# Patient Record
Sex: Male | Born: 1985 | Hispanic: No | Marital: Married | Smoking: Former smoker
Health system: Southern US, Community
[De-identification: ages and names within clinical notes are randomized; demographics above are authoritative.]

## PROBLEM LIST (undated history)

## (undated) DIAGNOSIS — J45909 Unspecified asthma, uncomplicated: Secondary | ICD-10-CM

---

## 2004-01-12 ENCOUNTER — Emergency Department (HOSPITAL_COMMUNITY): Admission: EM | Admit: 2004-01-12 | Discharge: 2004-01-13 | Payer: Self-pay | Admitting: Emergency Medicine

## 2004-01-22 ENCOUNTER — Emergency Department (HOSPITAL_COMMUNITY): Admission: EM | Admit: 2004-01-22 | Discharge: 2004-01-22 | Payer: Self-pay | Admitting: Family Medicine

## 2006-12-24 ENCOUNTER — Emergency Department (HOSPITAL_COMMUNITY): Admission: EM | Admit: 2006-12-24 | Discharge: 2006-12-24 | Payer: Self-pay | Admitting: Emergency Medicine

## 2011-07-05 ENCOUNTER — Inpatient Hospital Stay (INDEPENDENT_AMBULATORY_CARE_PROVIDER_SITE_OTHER)
Admission: RE | Admit: 2011-07-05 | Discharge: 2011-07-05 | Disposition: A | Discharge status: Home / Self Care | Payer: Self-pay | Source: Ambulatory Visit | Attending: Family Medicine | Admitting: Family Medicine

## 2011-07-05 DIAGNOSIS — R6889 Other general symptoms and signs: Secondary | ICD-10-CM

## 2012-09-01 ENCOUNTER — Emergency Department (HOSPITAL_COMMUNITY)
Admission: EM | Admit: 2012-09-01 | Discharge: 2012-09-01 | Disposition: A | Discharge status: Home / Self Care | Payer: Self-pay | Attending: Emergency Medicine | Admitting: Emergency Medicine

## 2012-09-01 ENCOUNTER — Emergency Department (HOSPITAL_COMMUNITY): Payer: Self-pay

## 2012-09-01 ENCOUNTER — Encounter (HOSPITAL_COMMUNITY): Payer: Self-pay | Admitting: Adult Health

## 2012-09-01 DIAGNOSIS — IMO0001 Reserved for inherently not codable concepts without codable children: Secondary | ICD-10-CM | POA: Insufficient documentation

## 2012-09-01 DIAGNOSIS — IMO0002 Reserved for concepts with insufficient information to code with codable children: Secondary | ICD-10-CM | POA: Insufficient documentation

## 2012-09-01 DIAGNOSIS — Z87891 Personal history of nicotine dependence: Secondary | ICD-10-CM | POA: Insufficient documentation

## 2012-09-01 DIAGNOSIS — R42 Dizziness and giddiness: Secondary | ICD-10-CM | POA: Insufficient documentation

## 2012-09-01 DIAGNOSIS — Y9389 Activity, other specified: Secondary | ICD-10-CM | POA: Insufficient documentation

## 2012-09-01 DIAGNOSIS — J45909 Unspecified asthma, uncomplicated: Secondary | ICD-10-CM | POA: Insufficient documentation

## 2012-09-01 DIAGNOSIS — S66919A Strain of unspecified muscle, fascia and tendon at wrist and hand level, unspecified hand, initial encounter: Secondary | ICD-10-CM

## 2012-09-01 DIAGNOSIS — S63509A Unspecified sprain of unspecified wrist, initial encounter: Secondary | ICD-10-CM | POA: Insufficient documentation

## 2012-09-01 DIAGNOSIS — M79639 Pain in unspecified forearm: Secondary | ICD-10-CM

## 2012-09-01 DIAGNOSIS — S6720XA Crushing injury of unspecified hand, initial encounter: Secondary | ICD-10-CM

## 2012-09-01 DIAGNOSIS — M7989 Other specified soft tissue disorders: Secondary | ICD-10-CM | POA: Insufficient documentation

## 2012-09-01 DIAGNOSIS — M25539 Pain in unspecified wrist: Secondary | ICD-10-CM | POA: Insufficient documentation

## 2012-09-01 DIAGNOSIS — Y92009 Unspecified place in unspecified non-institutional (private) residence as the place of occurrence of the external cause: Secondary | ICD-10-CM | POA: Insufficient documentation

## 2012-09-01 DIAGNOSIS — R209 Unspecified disturbances of skin sensation: Secondary | ICD-10-CM | POA: Insufficient documentation

## 2012-09-01 HISTORY — DX: Unspecified asthma, uncomplicated: J45.909

## 2012-09-01 LAB — BASIC METABOLIC PANEL
CO2: 28 mEq/L (ref 19–32)
Calcium: 9.6 mg/dL (ref 8.4–10.5)
Chloride: 100 mEq/L (ref 96–112)
Glucose, Bld: 101 mg/dL — ABNORMAL HIGH (ref 70–99)
Potassium: 4.4 mEq/L (ref 3.5–5.1)
Sodium: 136 mEq/L (ref 135–145)

## 2012-09-01 LAB — CBC WITH DIFFERENTIAL/PLATELET
Basophils Absolute: 0 10*3/uL (ref 0.0–0.1)
Basophils Relative: 0 % (ref 0–1)
Eosinophils Absolute: 0.1 10*3/uL (ref 0.0–0.7)
HCT: 45.2 % (ref 39.0–52.0)
Lymphs Abs: 2.2 10*3/uL (ref 0.7–4.0)
MCH: 29 pg (ref 26.0–34.0)
MCHC: 34.1 g/dL (ref 30.0–36.0)
Neutrophils Relative %: 54 % (ref 43–77)
Platelets: 201 10*3/uL (ref 150–400)
RBC: 5.31 MIL/uL (ref 4.22–5.81)
RDW: 13.1 % (ref 11.5–15.5)

## 2012-09-01 MED ORDER — IBUPROFEN 600 MG PO TABS: 600.0000 mg | ORAL_TABLET | Freq: Four times a day (QID) | ORAL | Status: DC | PRN

## 2012-09-01 MED ORDER — HYDROCODONE-ACETAMINOPHEN 5-325 MG PO TABS: 2.0000 | ORAL_TABLET | ORAL | Status: DC | PRN

## 2012-09-01 MED ORDER — HYDROCODONE-ACETAMINOPHEN 5-325 MG PO TABS
2.0000 | ORAL_TABLET | Freq: Once | ORAL | Status: AC
  Administered 2012-09-01: 2 via ORAL
  Filled 2012-09-01: qty 2

## 2012-09-01 NOTE — Progress Notes (Signed)
Orthopedic Tech Progress Note Patient Details:  Charles Padilla July 09, 1986 [id number]  Ortho Devices Type of Ortho Device: Velcro wrist splint Ortho Device/Splint Location: (R) UE Ortho Device/Splint Interventions: Application   Jennye Moccasin 09/01/2012, 6:23 PM

## 2012-09-01 NOTE — Discharge Instructions (Signed)
Hand Injuries There are many types of hand injuries. You or your child may have a minor broken bone (fracture), sprain, bruises, or burns on the hand. HOME CARE  Keep the hand raised (elevated) above the level of the heart. Do this for the first few days until the pain and puffiness (swelling) get better.  Hand bandages (dressings) and splints are used to keep the hand still. This helps with pain and prevents more injury.  Do not remove the bandage and splint until your doctor says it is okay.  For broken bones, sprains, and bruises, you may put ice on the injured area.  Put ice in a plastic bag.  Place a towel between the skin and the bag.  Leave the ice on for 15 to 20 minutes every few hours. Do this for 2 to 3 days.  Medicine for pain and redness or puffiness (inflammation) is often helpful.  Some hand motion after an injury can decrease stiffness.  Do not do any activities that increase pain in the hand.  See your doctor for follow-up care as told. GET HELP RIGHT AWAY IF:   The pain and puffiness get worse.  The pain is not controlled with medicine.  You or your child has a temperature by mouth above 102 F (38.9 C), not controlled by medicine.  There is pain when moving the fingers.  There is fluid (pus) coming from the wound. MAKE SURE YOU:   Understand these instructions.  Will watch this condition.  Will get help right away if you or your child is not doing well or gets worse. Document Released: 11/19/2009 Document Revised: 11/17/2011 Document Reviewed: 11/19/2009 [facility] Patient Information 2013 [place], [place]. Sprain A sprain happens when the bands of tissue that connect bones and hold joints together (ligaments) stretch too much or tear. HOME CARE  Raise (elevate) the injured area to lessen puffiness (swelling).  Put ice on the injured area.  Put ice in a plastic bag.  Place a towel between your skin and the bag.  Leave the ice on for 15 to 20  minutes, 3 to 4 times a day.  Do this for the first 24 hours or as told by your doctor.  Wear any splints, braces, castings, or elastic wraps as told by your child's doctor.  Eat healthy foods.  Only take medicine as told by your doctor. GET HELP RIGHT AWAY IF:   There is numbness or tingling in the injured limb.  The toes or fingers become blue or white in the injured limb.  The sprained limb is cold to the touch.  There is a sharp, shooting pain in the injured limb.  The puffiness is getting worse instead of better. MAKE SURE YOU:   Understand these instructions.  Will watch this condition.  Will get help right away if you are not doing well or get worse. Document Released: 02/11/2008 Document Revised: 11/17/2011 Document Reviewed: 07/11/2009 [facility] Patient Information 2013 [place], [place].

## 2012-09-01 NOTE — ED Provider Notes (Signed)
26 year old male had a sofa fall in his right arm. He is complaining of pain in his right forearm and right hand. Exam, he has tenderness over the ulnar aspect of his right hand, wrist, and distal forearm without point tenderness. Her neurovascular exam is intact. There is no deformity noted. Hand x-rays have been obtained and are unremarkable and reviewed by myself. However, the area of his forearm where he was tender was not adequately visualize, so he is being sent back for forearm x-rays  I saw and evaluated the patient, reviewed the resident's note and I agree with the findings and plan.   Dione Booze, MD 09/01/12 1740

## 2012-09-01 NOTE — ED Notes (Signed)
Patient states that when he is standing, he feels like he will fall. Also states if he wakes up at night and stands up, a similar feeling starts.

## 2012-09-01 NOTE — ED Provider Notes (Signed)
History     CSN: [id number]  Arrival date & time 09/01/12  1611   First MD Initiated Contact with Patient 09/01/12 1653      Chief Complaint  Patient presents with  . Hand Injury  . Dizziness    (Consider location/radiation/quality/duration/timing/severity/associated sxs/prior treatment) Patient is a 26 y.o. male presenting with injury. The history is provided by the patient. No language interpreter was used.  Injury  The incident occurred today. The incident occurred at home. The injury mechanism was a direct blow. Context: sofa  The wounds were self-inflicted. No protective equipment was used. He came to the ER via personal transport. There is an injury to the right wrist, right hand and right forearm. The pain is moderate. Pertinent negatives include no chest pain, no abdominal pain, no nausea, no vomiting, no headaches and no cough.    Past Medical History  Diagnosis Date  . Asthma     History reviewed. No pertinent past surgical history.  History reviewed. No pertinent family history.  History  Substance Use Topics  . Smoking status: Former Games developer  . Smokeless tobacco: Not on file  . Alcohol Use: No      Review of Systems  Constitutional: Negative for fever and chills.  HENT: Negative for congestion and sore throat.   Respiratory: Negative for cough and shortness of breath.   Cardiovascular: Negative for chest pain and leg swelling.  Gastrointestinal: Negative for nausea, vomiting, abdominal pain, diarrhea and constipation.  Genitourinary: Negative for dysuria and frequency.  Musculoskeletal: Positive for myalgias and arthralgias.  Skin: Negative for color change and rash.  Neurological: Negative for dizziness and headaches.  Psychiatric/Behavioral: Negative for confusion and agitation.  All other systems reviewed and are negative.    Allergies  Review of patient's allergies indicates no known allergies.  Home Medications  No current outpatient  prescriptions on file.  BP 120/84  Pulse 67  Temp 97.7 F (36.5 C) (Oral)  Resp 16  SpO2 100%  Physical Exam  Constitutional: He is oriented to person, place, and time. He appears well-developed and well-nourished. No distress.  HENT:  Head: Normocephalic and atraumatic.  Eyes: EOM are normal. Pupils are equal, round, and reactive to light.  Neck: Normal range of motion. Neck supple.  Cardiovascular: Normal rate and regular rhythm.   Pulmonary/Chest: Effort normal. No respiratory distress.  Abdominal: Soft. He exhibits no distension.  Musculoskeletal: He exhibits no edema.       Right wrist: He exhibits decreased range of motion, tenderness and bony tenderness. He exhibits no swelling.       Right forearm: He exhibits tenderness and bony tenderness. He exhibits no swelling.       Arms:      Right hand: He exhibits tenderness, bony tenderness and swelling.       Hands: Neurological: He is alert and oriented to person, place, and time.  Skin: Skin is warm and dry.  Psychiatric: He has a normal mood and affect. His behavior is normal.    ED Course  Procedures (including critical care time)  Results for orders placed during the hospital encounter of 09/01/12  CBC WITH DIFFERENTIAL      Component Value Range   WBC 6.4  4.0 - 10.5 K/uL   RBC 5.31  4.22 - 5.81 MIL/uL   Hemoglobin 15.4  13.0 - 17.0 g/dL   HCT 46.9  62.9 - 52.8 %   MCV 85.1  78.0 - 100.0 fL   MCH 29.0  26.0 -  34.0 pg   MCHC 34.1  30.0 - 36.0 g/dL   RDW 16.1  09.6 - 04.5 %   Platelets 201  150 - 400 K/uL   Neutrophils Relative 54  43 - 77 %   Neutro Abs 3.5  1.7 - 7.7 K/uL   Lymphocytes Relative 34  12 - 46 %   Lymphs Abs 2.2  0.7 - 4.0 K/uL   Monocytes Relative 10  3 - 12 %   Monocytes Absolute 0.6  0.1 - 1.0 K/uL   Eosinophils Relative 1  0 - 5 %   Eosinophils Absolute 0.1  0.0 - 0.7 K/uL   Basophils Relative 0  0 - 1 %   Basophils Absolute 0.0  0.0 - 0.1 K/uL  BASIC METABOLIC PANEL      Component Value  Range   Sodium 136  135 - 145 mEq/L   Potassium 4.4  3.5 - 5.1 mEq/L   Chloride 100  96 - 112 mEq/L   CO2 28  19 - 32 mEq/L   Glucose, Bld 101 (*) 70 - 99 mg/dL   BUN 11  6 - 23 mg/dL   Creatinine, Ser 4.09  0.50 - 1.35 mg/dL   Calcium 9.6  8.4 - 81.1 mg/dL   GFR calc non Af Amer >90  >90 mL/min   GFR calc Af Amer >90  >90 mL/min    DG Forearm Right (Final result)   Result time:09/01/12 1806    Final result by Rad Results In Interface (09/01/12 18:06:47)    Narrative:   *RADIOLOGY REPORT*  Clinical Data: Injury, pain.  RIGHT FOREARM - 2 VIEW  Comparison: None.  Findings: Imaged bones, joints and soft tissues appear normal.  IMPRESSION: Negative exam.   Original Report Authenticated By: Holley Dexter, M.D.             DG Hand Complete Right (Final result)   Result time:09/01/12 1645    Final result by Rad Results In Interface (09/01/12 16:45:57)    Narrative:   *RADIOLOGY REPORT*  Clinical Data: Status post fall. Pain.  RIGHT HAND - COMPLETE 3+ VIEW  Comparison: None.  Findings: There is no acute bony or joint abnormality. Small cyst or enchondroma in the triquetrum is incidentally noted. Soft tissues appear normal.  IMPRESSION: No acute finding.   Original Report Authenticated By: Holley Dexter, M.D.            No diagnosis found.    MDM  Pt sustained crush injury to right hand/forearm this am. Cough fell on him. C/o pain and swelling right forearm and hand w/ paresthesia. Admits to some dizziness 2/2 pain. No LOC or near syncope. Exam w/ swelling over dorsal aspect of hand, NVI, superficial abrasions to dorsal forearm, ttp mid ulna and radius and radial head and diffusely across hand. Will check xray hand/forearm. Will check bmp and cbc and give norco for pain.   xrays negative for fx. Labs unremarkable. Likely wrist sprain.  Given splint for wrist, motrin Q8hrs and small quantity of norco for breakthrough pain. Stable for d/c home  given return precautions and follow up instructions.    1. Crush injury to hand   2. Forearm pain   3. Wrist strain    New Prescriptions   HYDROCODONE-ACETAMINOPHEN (NORCO) 5-325 MG PER TABLET    Take 2 tablets by mouth every 4 (four) hours as needed for pain.   IBUPROFEN (ADVIL,MOTRIN) 600 MG TABLET    Take 1 tablet (600 mg total) by  mouth every 6 (six) hours as needed for pain.   [facility] EMERGENCY DEPARTMENT [address] [id number] mc [place] [address] [phone]           Audelia Hives, MD 09/02/12 [phone]

## 2012-09-01 NOTE — ED Notes (Signed)
Pt presents with right hand injury and edema from a couch that fell on right hand today while trying to move a couch. Pt is also concerned because he has lightheadedness and "fainting feeling" with change of position that has been happening more often began 2-3 weeks ago. Denies Loss of consciousness, denies actually fainting. Denies chest pain and SOB. He is alert, oriented and MAEx4.

## 2012-09-01 NOTE — ED Notes (Signed)
Sharp/dull sensation in right hand slightly delayed from sensation in left hand

## 2013-06-22 ENCOUNTER — Emergency Department (HOSPITAL_COMMUNITY)
Admission: EM | Admit: 2013-06-22 | Discharge: 2013-06-22 | Disposition: A | Discharge status: Home / Self Care | Payer: No Typology Code available for payment source | Attending: Emergency Medicine | Admitting: Emergency Medicine

## 2013-06-22 ENCOUNTER — Encounter (HOSPITAL_COMMUNITY): Payer: Self-pay | Admitting: Emergency Medicine

## 2013-06-22 DIAGNOSIS — R51 Headache: Secondary | ICD-10-CM | POA: Insufficient documentation

## 2013-06-22 DIAGNOSIS — Z87891 Personal history of nicotine dependence: Secondary | ICD-10-CM | POA: Insufficient documentation

## 2013-06-22 DIAGNOSIS — J45909 Unspecified asthma, uncomplicated: Secondary | ICD-10-CM | POA: Insufficient documentation

## 2013-06-22 LAB — POCT I-STAT, CHEM 8
BUN: 6 mg/dL (ref 6–23)
Calcium, Ion: 1.11 mmol/L — ABNORMAL LOW (ref 1.12–1.23)
Chloride: 105 mEq/L (ref 96–112)
Creatinine, Ser: 0.9 mg/dL (ref 0.50–1.35)
HCT: 49 % (ref 39.0–52.0)
Potassium: 4.1 mEq/L (ref 3.5–5.1)

## 2013-06-22 MED ORDER — ONDANSETRON HCL 4 MG PO TABS: 4.0000 mg | ORAL_TABLET | Freq: Four times a day (QID) | ORAL | Status: DC

## 2013-06-22 MED ORDER — OXYCODONE-ACETAMINOPHEN 5-325 MG PO TABS
2.0000 | ORAL_TABLET | Freq: Once | ORAL | Status: AC
  Administered 2013-06-22: 2 via ORAL
  Filled 2013-06-22: qty 2

## 2013-06-22 MED ORDER — OXYCODONE-ACETAMINOPHEN 5-325 MG PO TABS: 1.0000 | ORAL_TABLET | Freq: Four times a day (QID) | ORAL | Status: DC | PRN

## 2013-06-22 MED ORDER — SODIUM CHLORIDE 0.9 % IV BOLUS (SEPSIS)
1000.0000 mL | Freq: Once | INTRAVENOUS | Status: AC
  Administered 2013-06-22: 1000 mL via INTRAVENOUS

## 2013-06-22 MED ORDER — ONDANSETRON 4 MG PO TBDP
8.0000 mg | ORAL_TABLET | Freq: Once | ORAL | Status: AC
  Administered 2013-06-22: 8 mg via ORAL
  Filled 2013-06-22: qty 2

## 2013-06-22 MED ORDER — KETOROLAC TROMETHAMINE 30 MG/ML IJ SOLN
30.0000 mg | Freq: Once | INTRAMUSCULAR | Status: AC
  Administered 2013-06-22: 30 mg via INTRAVENOUS
  Filled 2013-06-22: qty 1

## 2013-06-22 MED ORDER — DEXAMETHASONE SODIUM PHOSPHATE 10 MG/ML IJ SOLN
10.0000 mg | Freq: Once | INTRAMUSCULAR | Status: AC
  Administered 2013-06-22: 10 mg via INTRAVENOUS
  Filled 2013-06-22: qty 1

## 2013-06-22 MED ORDER — DIPHENHYDRAMINE HCL 50 MG/ML IJ SOLN
25.0000 mg | Freq: Once | INTRAMUSCULAR | Status: AC
  Administered 2013-06-22: 25 mg via INTRAVENOUS
  Filled 2013-06-22: qty 1

## 2013-06-22 NOTE — ED Provider Notes (Signed)
CSN: [id number]     Arrival date & time 06/22/13  1553 History   First MD Initiated Contact with Patient 06/22/13 2006     Chief Complaint  Patient presents with  . Headache   (Consider location/radiation/quality/duration/timing/severity/associated sxs/prior Treatment) HPI  Charles Padilla is a 27 y.o.male without any significant PMH presents to the ER with complaints of headache since Monday after taking a sexual enhancement pill.  He says that he does not need it but wanted to see what it would do and since then he has had a frontal lobe pulsating headache that has not been relieving with OTC analgesics. He denies feeling nausea, having cough, sore throat, ear pain, abdominal pains, neck pains or weakness (focal or generalized).    Past Medical History  Diagnosis Date  . Asthma    History reviewed. No pertinent past surgical history. No family history on file. History  Substance Use Topics  . Smoking status: Former Games developer  . Smokeless tobacco: Not on file  . Alcohol Use: No    Review of Systems The patient denies anorexia, fever, weight loss,, vision loss, decreased hearing, hoarseness, chest pain, syncope, dyspnea on exertion, peripheral edema, balance deficits, hemoptysis, abdominal pain, melena, hematochezia, severe indigestion/heartburn, hematuria, incontinence, genital sores, muscle weakness, suspicious skin lesions, transient blindness, difficulty walking, depression, unusual weight change, abnormal bleeding, enlarged lymph nodes, angioedema, and breast masses.  Allergies  Review of patient's allergies indicates no known allergies.  Home Medications   Current Outpatient Rx  Name  Route  Sig  Dispense  Refill  . guaiFENesin-dextromethorphan (ROBITUSSIN DM) 100-10 MG/5ML syrup   Oral   Take 5 mLs by mouth 3 (three) times daily as needed for cough.         Marland Kitchen ibuprofen (ADVIL,MOTRIN) 600 MG tablet   Oral   Take 1 tablet (600 mg total) by mouth every 6 (six) hours as  needed for pain.   30 tablet   0   . OVER THE COUNTER MEDICATION   Oral   Take 1 tablet by mouth as needed. [employer] shop male enhancement pill         . ondansetron (ZOFRAN) 4 MG tablet   Oral   Take 1 tablet (4 mg total) by mouth every 6 (six) hours.   12 tablet   0   . oxyCODONE-acetaminophen (PERCOCET/ROXICET) 5-325 MG per tablet   Oral   Take 1 tablet by mouth every 6 (six) hours as needed for pain.   15 tablet   0    BP 111/72  Pulse 75  Temp(Src) 98.3 F (36.8 C) (Oral)  Resp 12  Wt 154 lb 3.2 oz (69.945 kg)  SpO2 98% Physical Exam  Nursing note and vitals reviewed. Constitutional: He is oriented to person, place, and time. He appears well-developed and well-nourished. No distress.  HENT:  Head: Normocephalic and atraumatic. Head is without raccoon's eyes, without Battle's sign, without right periorbital erythema and without left periorbital erythema.  Right Ear: Tympanic membrane and ear canal normal.  Left Ear: Tympanic membrane and ear canal normal.  Mouth/Throat: Uvula is midline and oropharynx is clear and moist.  Eyes: Pupils are equal, round, and reactive to light.  Neck: Normal range of motion. Neck supple. No spinous process tenderness and no muscular tenderness present. No Brudzinski's sign and no Kernig's sign noted.  Cardiovascular: Normal rate and regular rhythm.   Pulmonary/Chest: Effort normal.  Abdominal: Soft.  Neurological: He is alert and oriented to person, place, and time. He  has normal strength. No cranial nerve deficit or sensory deficit. He displays a negative Romberg sign.  Skin: Skin is warm and dry.    ED Course  Procedures (including critical care time) Labs Review Labs Reviewed  POCT I-STAT, CHEM 8 - Abnormal; Notable for the following:    Calcium, Ion 1.11 (*)    All other components within normal limits   Imaging Review No results found.  EKG Interpretation   None       MDM   1. Headache      Patient given 1L  IV normal saline, 10mg  IV Decadron, 25mg  IV Benadryl and 30mg  IV Toradol. Pain  Head relieved to a 7/10 Given 2 percocets PO and headache relieved to 3-5/10. Pt does not have any meningeal signs, throat and ears look good. No confusion, fever, change in vision, weakness. Frontal throbbing headache that relieves with medication.  Discussed strict return to ED precautions.  27 y.o.Charles Padilla's evaluation in the Emergency Department is complete. It has been determined that no acute conditions requiring further emergency intervention are present at this time. The patient/guardian have been advised of the diagnosis and plan. We have discussed signs and symptoms that warrant return to the ED, such as changes or worsening in symptoms.  Vital signs are stable at discharge. Filed Vitals:   06/22/13 2221  BP: 111/72  Pulse: 75  Temp: 98.3 F (36.8 C)  Resp: 12    Patient/guardian has voiced understanding and agreed to follow-up with the PCP or specialist.   Dorthula Matas, PA-C 06/22/13 2317

## 2013-06-22 NOTE — ED Notes (Signed)
The pt has had a headache for 2 days and he missed work yesterday.  He still has a headache

## 2013-06-23 NOTE — ED Provider Notes (Signed)
Medical screening examination/treatment/procedure(s) were performed by non-physician practitioner and as supervising physician I was immediately available for consultation/collaboration.   Roney Marion, MD 06/23/13 1344

## 2013-10-21 ENCOUNTER — Encounter (HOSPITAL_COMMUNITY): Payer: Self-pay | Admitting: Emergency Medicine

## 2013-10-21 ENCOUNTER — Emergency Department (INDEPENDENT_AMBULATORY_CARE_PROVIDER_SITE_OTHER)
Admission: EM | Admit: 2013-10-21 | Discharge: 2013-10-21 | Disposition: A | Discharge status: Home / Self Care | Payer: No Typology Code available for payment source | Source: Home / Self Care | Attending: Family Medicine | Admitting: Family Medicine

## 2013-10-21 DIAGNOSIS — R05 Cough: Secondary | ICD-10-CM

## 2013-10-21 DIAGNOSIS — J329 Chronic sinusitis, unspecified: Secondary | ICD-10-CM

## 2013-10-21 DIAGNOSIS — R059 Cough, unspecified: Secondary | ICD-10-CM

## 2013-10-21 MED ORDER — IPRATROPIUM BROMIDE 0.06 % NA SOLN: 2.0000 | Freq: Four times a day (QID) | NASAL | Status: DC

## 2013-10-21 MED ORDER — GUAIFENESIN-CODEINE 100-10 MG/5ML PO SOLN: 5.0000 mL | Freq: Every evening | ORAL | Status: DC | PRN

## 2013-10-21 MED ORDER — PREDNISONE 10 MG PO TABS
30.0000 mg | ORAL_TABLET | Freq: Every day | ORAL | Status: DC
Start: 2013-10-21 — End: 2013-10-21

## 2013-10-21 MED ORDER — IPRATROPIUM BROMIDE 0.06 % NA SOLN
2.0000 | Freq: Four times a day (QID) | NASAL | Status: DC
Start: 2013-10-21 — End: 2015-03-01

## 2013-10-21 MED ORDER — AMOXICILLIN 500 MG PO CAPS: 1000.0000 mg | ORAL_CAPSULE | Freq: Two times a day (BID) | ORAL | Status: DC

## 2013-10-21 MED ORDER — PREDNISONE 10 MG PO TABS: 30.0000 mg | ORAL_TABLET | Freq: Every day | ORAL | Status: DC

## 2013-10-21 NOTE — ED Provider Notes (Signed)
Charles Padilla is a 28 y.o. male who presents to Urgent Care today for left facial pain associated with nasal discharge and a productive cough. He also notes right ear pressure. He denies any shortness of breath nausea vomiting or diarrhea. He has tried some Tylenol and ibuprofen which is up a little. No shortness of breath.   Past Medical History  Diagnosis Date  . Asthma    History  Substance Use Topics  . Smoking status: Former Games developermoker  . Smokeless tobacco: Not on file  . Alcohol Use: No   ROS as above Medications: No current facility-administered medications for this encounter.   Current Outpatient Prescriptions  Medication Sig Dispense Refill  . amoxicillin (AMOXIL) 500 MG capsule Take 2 capsules (1,000 mg total) by mouth 2 (two) times daily.  28 capsule  0  . guaiFENesin-codeine 100-10 MG/5ML syrup Take 5 mLs by mouth at bedtime as needed for cough.  120 mL  0  . ipratropium (ATROVENT) 0.06 % nasal spray Place 2 sprays into both nostrils 4 (four) times daily.  15 mL  1  . predniSONE (DELTASONE) 10 MG tablet Take 3 tablets (30 mg total) by mouth daily.  15 tablet  0    Exam:  BP 125/85  Pulse 70  Temp(Src) 98.2 F (36.8 C) (Oral)  Resp 18  SpO2 100% Gen: Well NAD HEENT: EOMI,  MMM tender palpation left maxillary sinus. Tympanic membranes are normal bilaterally. Posterior pharynx is normal as well. Lungs: Normal work of breathing. CTABL Heart: RRR no MRG Abd: NABS, Soft. NT, ND Exts: Brisk capillary refill, warm and well perfused.    Assessment and Plan: 28 y.o. male with sinusitis and cough. Plan to treat with prednisone, amoxicillin, codeine containing cough medication, and Atrovent nasal spray  Discussed warning signs or symptoms. Please see discharge instructions. Patient expresses understanding.    Rodolph BongEvan S Kazzandra Desaulniers, MD 10/21/13 [phone]

## 2013-10-21 NOTE — ED Notes (Signed)
C/o  Nasal stuffiness.  Congestion.  Productive cough with yellow sputum.  Body aches.  Pain on left side of face and pain felt in roof of mouth/teeth.  Throat irritation.   No relief with otc meds.  Symptoms present since Tuesday.  Denies n/v/d.  Low grade temp.

## 2013-10-21 NOTE — Discharge Instructions (Signed)
Thank you for coming in today. Take prednisone and amoxicillin. Use Atrovent nasal spray. Use codeine containing cough medication at bedtime as needed. Call or go to the emergency room if you get worse, have trouble breathing, have chest pains, or palpitations.   Sinusitis Sinusitis is redness, soreness, and swelling (inflammation) of the paranasal sinuses. Paranasal sinuses are air pockets within the bones of your face (beneath the eyes, the middle of the forehead, or above the eyes). In healthy paranasal sinuses, mucus is able to drain out, and air is able to circulate through them by way of your nose. However, when your paranasal sinuses are inflamed, mucus and air can become trapped. This can allow bacteria and other germs to grow and cause infection. Sinusitis can develop quickly and last only a short time (acute) or continue over a long period (chronic). Sinusitis that lasts for more than 12 weeks is considered chronic.  CAUSES  Causes of sinusitis include:  Allergies.  Structural abnormalities, such as displacement of the cartilage that separates your nostrils (deviated septum), which can decrease the air flow through your nose and sinuses and affect sinus drainage.  Functional abnormalities, such as when the small hairs (cilia) that line your sinuses and help remove mucus do not work properly or are not present. SYMPTOMS  Symptoms of acute and chronic sinusitis are the same. The primary symptoms are pain and pressure around the affected sinuses. Other symptoms include:  Upper toothache.  Earache.  Headache.  Bad breath.  Decreased sense of smell and taste.  A cough, which worsens when you are lying flat.  Fatigue.  Fever.  Thick drainage from your nose, which often is green and may contain pus (purulent).  Swelling and warmth over the affected sinuses. DIAGNOSIS  Your caregiver will perform a physical exam. During the exam, your caregiver may:  Look in your nose for  signs of abnormal growths in your nostrils (nasal polyps).  Tap over the affected sinus to check for signs of infection.  View the inside of your sinuses (endoscopy) with a special imaging device with a light attached (endoscope), which is inserted into your sinuses. If your caregiver suspects that you have chronic sinusitis, one or more of the following tests may be recommended:  Allergy tests.  Nasal culture A sample of mucus is taken from your nose and sent to a lab and screened for bacteria.  Nasal cytology A sample of mucus is taken from your nose and examined by your caregiver to determine if your sinusitis is related to an allergy. TREATMENT  Most cases of acute sinusitis are related to a viral infection and will resolve on their own within 10 days. Sometimes medicines are prescribed to help relieve symptoms (pain medicine, decongestants, nasal steroid sprays, or saline sprays).  However, for sinusitis related to a bacterial infection, your caregiver will prescribe antibiotic medicines. These are medicines that will help kill the bacteria causing the infection.  Rarely, sinusitis is caused by a fungal infection. In theses cases, your caregiver will prescribe antifungal medicine. For some cases of chronic sinusitis, surgery is needed. Generally, these are cases in which sinusitis recurs more than 3 times per year, despite other treatments. HOME CARE INSTRUCTIONS   Drink plenty of water. Water helps thin the mucus so your sinuses can drain more easily.  Use a humidifier.  Inhale steam 3 to 4 times a day (for example, sit in the bathroom with the shower running).  Apply a warm, moist washcloth to your face  3 to 4 times a day, or as directed by your caregiver.  Use saline nasal sprays to help moisten and clean your sinuses.  Take over-the-counter or prescription medicines for pain, discomfort, or fever only as directed by your caregiver. SEEK IMMEDIATE MEDICAL CARE IF:  You have  increasing pain or severe headaches.  You have nausea, vomiting, or drowsiness.  You have swelling around your face.  You have vision problems.  You have a stiff neck.  You have difficulty breathing. MAKE SURE YOU:   Understand these instructions.  Will watch your condition.  Will get help right away if you are not doing well or get worse. Document Released: 08/25/2005 Document Revised: 11/17/2011 Document Reviewed: 09/09/2011 [facility] Patient Information 2014 [place], [place].   Cough, Adult  A cough is a reflex. It helps you clear your throat and airways. A cough can help heal your body. A cough can last 2 or 3 weeks (acute) or may last more than 8 weeks (chronic). Some common causes of a cough can include an infection, allergy, or a cold. HOME CARE  Only take medicine as told by your doctor.  If given, take your medicines (antibiotics) as told. Finish them even if you start to feel better.  Use a cold steam vaporizer or humidier in your home. This can help loosen thick spit (secretions).  Sleep so you are almost sitting up (semi-upright). Use pillows to do this. This helps reduce coughing.  Rest as needed.  Stop smoking if you smoke. GET HELP RIGHT AWAY IF:  You have yellowish-white fluid (pus) in your thick spit.  Your cough gets worse.  Your medicine does not reduce coughing, and you are losing sleep.  You cough up blood.  You have trouble breathing.  Your pain gets worse and medicine does not help.  You have a fever. MAKE SURE YOU:   Understand these instructions.  Will watch your condition.  Will get help right away if you are not doing well or get worse. Document Released: 05/08/2011 Document Revised: 11/17/2011 Document Reviewed: 05/08/2011 [facility] Patient Information 2014 [place], [place].

## 2014-07-22 ENCOUNTER — Emergency Department (HOSPITAL_COMMUNITY)
Admission: EM | Admit: 2014-07-22 | Discharge: 2014-07-22 | Disposition: A | Discharge status: Home / Self Care | Payer: Self-pay | Attending: Emergency Medicine | Admitting: Emergency Medicine

## 2014-07-22 ENCOUNTER — Encounter (HOSPITAL_COMMUNITY): Payer: Self-pay | Admitting: *Deleted

## 2014-07-22 DIAGNOSIS — S46212A Strain of muscle, fascia and tendon of other parts of biceps, left arm, initial encounter: Secondary | ICD-10-CM | POA: Insufficient documentation

## 2014-07-22 DIAGNOSIS — T148XXA Other injury of unspecified body region, initial encounter: Secondary | ICD-10-CM

## 2014-07-22 DIAGNOSIS — R0981 Nasal congestion: Secondary | ICD-10-CM | POA: Insufficient documentation

## 2014-07-22 DIAGNOSIS — Z87891 Personal history of nicotine dependence: Secondary | ICD-10-CM | POA: Insufficient documentation

## 2014-07-22 DIAGNOSIS — Z792 Long term (current) use of antibiotics: Secondary | ICD-10-CM | POA: Insufficient documentation

## 2014-07-22 DIAGNOSIS — J45909 Unspecified asthma, uncomplicated: Secondary | ICD-10-CM | POA: Insufficient documentation

## 2014-07-22 DIAGNOSIS — Z7952 Long term (current) use of systemic steroids: Secondary | ICD-10-CM | POA: Insufficient documentation

## 2014-07-22 DIAGNOSIS — X58XXXA Exposure to other specified factors, initial encounter: Secondary | ICD-10-CM | POA: Insufficient documentation

## 2014-07-22 DIAGNOSIS — Y9389 Activity, other specified: Secondary | ICD-10-CM | POA: Insufficient documentation

## 2014-07-22 DIAGNOSIS — Y9289 Other specified places as the place of occurrence of the external cause: Secondary | ICD-10-CM | POA: Insufficient documentation

## 2014-07-22 DIAGNOSIS — S46912A Strain of unspecified muscle, fascia and tendon at shoulder and upper arm level, left arm, initial encounter: Secondary | ICD-10-CM | POA: Insufficient documentation

## 2014-07-22 DIAGNOSIS — R1110 Vomiting, unspecified: Secondary | ICD-10-CM | POA: Insufficient documentation

## 2014-07-22 DIAGNOSIS — Y990 Civilian activity done for income or pay: Secondary | ICD-10-CM | POA: Insufficient documentation

## 2014-07-22 DIAGNOSIS — H02844 Edema of left upper eyelid: Secondary | ICD-10-CM | POA: Insufficient documentation

## 2014-07-22 DIAGNOSIS — H02846 Edema of left eye, unspecified eyelid: Secondary | ICD-10-CM

## 2014-07-22 MED ORDER — IBUPROFEN 600 MG PO TABS: 600.0000 mg | ORAL_TABLET | Freq: Four times a day (QID) | ORAL | Status: DC | PRN

## 2014-07-22 MED ORDER — METHOCARBAMOL 500 MG PO TABS: 500.0000 mg | ORAL_TABLET | Freq: Two times a day (BID) | ORAL | Status: DC

## 2014-07-22 MED ORDER — LORATADINE 10 MG PO TABS: 10.0000 mg | ORAL_TABLET | Freq: Every day | ORAL | Status: DC

## 2014-07-22 NOTE — ED Notes (Signed)
Pt c/o left eye pain. Pt states his left eye becomes irritated when he reads after work. Pt reports having a sty on his left eye three months ago. Symptoms started after dx of sty to left eye. Pt also c/o left arm pain and back pain.

## 2014-07-22 NOTE — ED Provider Notes (Signed)
CSN: [id number]     Arrival date & time 07/22/14  0114 History   First MD Initiated Contact with Patient 07/22/14 0305     Chief Complaint  Patient presents with  . Eye Problem     (Consider location/radiation/quality/duration/timing/severity/associated sxs/prior Treatment) HPI Patient presents with 3 months of left upper eyelid puffiness and drooping. He states it's worse when staring at a screen.states he had a stye in the same lid prior to these symptoms beginning. He was treated with warm compresses and resolved. Patient denies any visual changes. He's had no pain, redness. He has had increased discharge from the left eye. No new exposures.  Patient also complains of 2 weeks of left upper back and left arm pain. States he does a lot of heavy lifting at his work. The pain is worse with movement. He has no numbness or weakness. Denies any acute injury. No swelling or deformity. Past Medical History  Diagnosis Date  . Asthma    History reviewed. No pertinent past surgical history. History reviewed. No pertinent family history. History  Substance Use Topics  . Smoking status: Former Games developermoker  . Smokeless tobacco: Not on file  . Alcohol Use: No    Review of Systems  Constitutional: Negative for fever and chills.  HENT: Positive for congestion. Negative for sinus pressure and sore throat.   Eyes: Positive for discharge. Negative for photophobia, pain, redness, itching and visual disturbance.  Respiratory: Negative for cough and shortness of breath.   Cardiovascular: Negative for chest pain.  Gastrointestinal: Positive for vomiting. Negative for nausea, abdominal pain and diarrhea.  Musculoskeletal: Positive for myalgias and back pain. Negative for arthralgias, neck pain and neck stiffness.  Skin: Negative for rash and wound.  Neurological: Negative for dizziness, weakness, light-headedness, numbness and headaches.  All other systems reviewed and are negative.     Allergies   Review of patient's allergies indicates no known allergies.  Home Medications   Prior to Admission medications   Medication Sig Start Date End Date Taking? Authorizing Provider  ibuprofen (ADVIL,MOTRIN) 200 MG tablet Take 400 mg by mouth every 6 (six) hours as needed for moderate pain.   Yes Historical Provider, MD  amoxicillin (AMOXIL) 500 MG capsule Take 2 capsules (1,000 mg total) by mouth 2 (two) times daily. Patient not taking: Reported on 07/22/2014 10/21/13   Rodolph BongEvan S Corey, MD  guaiFENesin-codeine 100-10 MG/5ML syrup Take 5 mLs by mouth at bedtime as needed for cough. Patient not taking: Reported on 07/22/2014 10/21/13   Rodolph BongEvan S Corey, MD  ipratropium (ATROVENT) 0.06 % nasal spray Place 2 sprays into both nostrils 4 (four) times daily. Patient not taking: Reported on 07/22/2014 10/21/13   Rodolph BongEvan S Corey, MD  predniSONE (DELTASONE) 10 MG tablet Take 3 tablets (30 mg total) by mouth daily. Patient not taking: Reported on 07/22/2014 10/21/13   Rodolph BongEvan S Corey, MD   BP 111/70 mmHg  Pulse 64  Temp(Src) 98.1 F (36.7 C) (Oral)  Resp 22  Ht 5\' 9"  (1.753 m)  Wt 143 lb (64.864 kg)  BMI 21.11 kg/m2  SpO2 100% Physical Exam  Constitutional: He is oriented to person, place, and time. He appears well-developed and well-nourished. No distress.  HENT:  Head: Normocephalic and atraumatic.  Mouth/Throat: Oropharynx is clear and moist. No oropharyngeal exudate.  Eyes: EOM are normal. Pupils are equal, round, and reactive to light. Right eye exhibits no discharge. Left eye exhibits no discharge.  Mildly swollen left upper eyelid compared to right. No obvious  masses or styes. No conjunctival injection. No visualized foreign bodies. Pupils equal round reactive to light.  Neck: Normal range of motion. Neck supple.  Cardiovascular: Normal rate and regular rhythm.  Exam reveals no gallop and no friction rub.   No murmur heard. Pulmonary/Chest: Effort normal and breath sounds normal. No respiratory distress.  He has no wheezes. He has no rales. He exhibits no tenderness.  Abdominal: Soft. Bowel sounds are normal. He exhibits no distension and no mass. There is no tenderness. There is no rebound and no guarding.  Musculoskeletal: Normal range of motion. He exhibits tenderness. He exhibits no edema.  Mild tenderness to palpation over the left bicep, deltoid and trapezius muscles.no midline cervical or thoracic tenderness to palpation. Patient has full range of motion of all joints without any swelling, warmth or deformity. Distal pulses intact.  Neurological: He is alert and oriented to person, place, and time.  5/5 motor in all extremities. Sensation is intact.  Skin: Skin is warm and dry. No rash noted. No erythema.  Psychiatric: He has a normal mood and affect. His behavior is normal.  Nursing note and vitals reviewed.   ED Course  Procedures (including critical care time) Labs Review Labs Reviewed - No data to display  Imaging Review No results found.   EKG Interpretation None      MDM   Final diagnoses:  None   Question whether seasonal allergies may be causing some swelling around the patient's eyes. The actual globe without any physical exam abnormality.Will give a trial of antihistamine and see if symptoms improve. Left upper extremity and backl pain likely due to muscle strain from heavy lifting. Will treat with NSAIDs and muscle relaxants. Patient is given return precautions and is voiced understanding.    Loren Raceravid Tosha Belgarde, MD 07/22/14 [phone]

## 2014-07-22 NOTE — Discharge Instructions (Signed)

## 2014-07-22 NOTE — ED Notes (Signed)
Patient is alert and orientedx4.  Patient was explained discharge instructions and they understood them with no questions.   

## 2014-10-01 ENCOUNTER — Encounter (HOSPITAL_COMMUNITY): Payer: Self-pay | Admitting: *Deleted

## 2014-10-01 ENCOUNTER — Emergency Department (HOSPITAL_COMMUNITY)
Admission: EM | Admit: 2014-10-01 | Discharge: 2014-10-02 | Disposition: A | Discharge status: Home / Self Care | Payer: 59 | Attending: Emergency Medicine | Admitting: Emergency Medicine

## 2014-10-01 DIAGNOSIS — J45909 Unspecified asthma, uncomplicated: Secondary | ICD-10-CM | POA: Insufficient documentation

## 2014-10-01 DIAGNOSIS — Z79899 Other long term (current) drug therapy: Secondary | ICD-10-CM | POA: Insufficient documentation

## 2014-10-01 DIAGNOSIS — K5900 Constipation, unspecified: Secondary | ICD-10-CM | POA: Insufficient documentation

## 2014-10-01 DIAGNOSIS — Z87891 Personal history of nicotine dependence: Secondary | ICD-10-CM | POA: Insufficient documentation

## 2014-10-01 DIAGNOSIS — R140 Abdominal distension (gaseous): Secondary | ICD-10-CM | POA: Insufficient documentation

## 2014-10-01 MED ORDER — OMEPRAZOLE 20 MG PO CPDR
20.0000 mg | DELAYED_RELEASE_CAPSULE | Freq: Every day | ORAL | Status: DC
Start: 2014-10-01 — End: 2015-03-01

## 2014-10-01 MED ORDER — GI COCKTAIL ~~LOC~~
30.0000 mL | Freq: Once | ORAL | Status: DC
  Filled 2014-10-01: qty 30

## 2014-10-01 NOTE — ED Provider Notes (Signed)
CSN: [id number]     Arrival date & time 10/01/14  1831 History   First MD Initiated Contact with Patient 10/01/14 2125     Chief Complaint  Patient presents with  . Constipation  . Anorexia   (Consider location/radiation/quality/duration/timing/severity/associated sxs/prior Treatment) HPI Charles Padilla is a 29 yo male presenting with report of bloated feeling worse in the last week. He states he was diagnosed with constipation 3 weeks ago and started on miralax which he has been taking intermittently.  He reports he has started to have bowel movements but they are still hard and small.  He has also started eating food with more fiber including cereal, fruits and beans.  Since then, he has had a sensation of fullness when he eats small amounts and a "fullness" when he lays down at night. This full feeling has caused him to eat less and he has noticed some weight loss in the last month.  He was seen by another doctor a few days ago with similar symptom and was started on gas-x which has helped the bloated feeling.  Pt states he wants to make sure he is receiving the appropriate treatment.  He denies fevers, chills, or abd pain.  Past Medical History  Diagnosis Date  . Asthma    History reviewed. No pertinent past surgical history. History reviewed. No pertinent family history. History  Substance Use Topics  . Smoking status: Former Games developer  . Smokeless tobacco: Not on file  . Alcohol Use: No    Review of Systems  Constitutional: Negative for fever and chills.  HENT: Negative for sore throat.   Eyes: Negative for visual disturbance.  Respiratory: Negative for cough and shortness of breath.   Cardiovascular: Negative for chest pain and leg swelling.  Gastrointestinal: Positive for constipation. Negative for nausea, vomiting and diarrhea.       Bloated sensation  Genitourinary: Negative for dysuria.  Musculoskeletal: Negative for myalgias.  Skin: Negative for rash.  Neurological:  Negative for weakness, numbness and headaches.    Allergies  Review of patient's allergies indicates no known allergies.  Home Medications   Prior to Admission medications   Medication Sig Start Date End Date Taking? Authorizing Provider  polyethylene glycol powder (GLYCOLAX/MIRALAX) powder Take 17 g by mouth daily as needed (constipation).  09/27/14  Yes Historical Provider, MD  Simethicone (GAS-X PO) Take 1-2 tablets by mouth 2 (two) times daily as needed (gas build up).   Yes Historical Provider, MD  triamcinolone cream (KENALOG) 0.1 % Apply 1 application topically 2 (two) times daily as needed (swelling).  09/27/14  Yes Historical Provider, MD  amoxicillin (AMOXIL) 500 MG capsule Take 2 capsules (1,000 mg total) by mouth 2 (two) times daily. Patient not taking: Reported on 07/22/2014 10/21/13   Rodolph Bong, MD  guaiFENesin-codeine 100-10 MG/5ML syrup Take 5 mLs by mouth at bedtime as needed for cough. Patient not taking: Reported on 07/22/2014 10/21/13   Rodolph Bong, MD  ibuprofen (ADVIL,MOTRIN) 600 MG tablet Take 1 tablet (600 mg total) by mouth every 6 (six) hours as needed for moderate pain. Patient not taking: Reported on 10/01/2014 07/22/14   Loren Racer, MD  ipratropium (ATROVENT) 0.06 % nasal spray Place 2 sprays into both nostrils 4 (four) times daily. Patient not taking: Reported on 07/22/2014 10/21/13   Rodolph Bong, MD  loratadine (CLARITIN) 10 MG tablet Take 1 tablet (10 mg total) by mouth daily. Patient not taking: Reported on 10/01/2014 07/22/14   Loren Racer, MD  methocarbamol (ROBAXIN) 500 MG tablet Take 1 tablet (500 mg total) by mouth 2 (two) times daily. Patient not taking: Reported on 10/01/2014 07/22/14   Loren Raceravid Yelverton, MD  predniSONE (DELTASONE) 10 MG tablet Take 3 tablets (30 mg total) by mouth daily. Patient not taking: Reported on 07/22/2014 10/21/13   Rodolph BongEvan S Corey, MD   BP 116/78 mmHg  Pulse 73  Temp(Src) 98.5 F (36.9 C) (Oral)  Resp 18  Ht 5\' 9"   (1.753 m)  Wt 130 lb (58.968 kg)  BMI 19.19 kg/m2  SpO2 100% Physical Exam  Constitutional: He is oriented to person, place, and time. He appears well-developed and well-nourished. No distress.  HENT:  Head: Normocephalic and atraumatic.  Eyes: Conjunctivae are normal.  Neck: Neck supple. No thyromegaly present.  Cardiovascular: Normal rate, regular rhythm and intact distal pulses.   Pulmonary/Chest: Effort normal and breath sounds normal. No respiratory distress.  Abdominal: Soft. Bowel sounds are normal. He exhibits no distension and no mass. There is no hepatosplenomegaly. There is no tenderness. There is no rigidity, no rebound, no guarding, no CVA tenderness and negative Murphy's sign.  Pt has no tenderness to palpation  Musculoskeletal: He exhibits no tenderness.  Lymphadenopathy:    He has no cervical adenopathy.  Neurological: He is alert and oriented to person, place, and time.  Skin: Skin is warm and dry. No rash noted. He is not diaphoretic.  Psychiatric: He has a normal mood and affect.  Nursing note and vitals reviewed.   ED Course  Procedures (including critical care time) Labs Review Labs Reviewed - No data to display  Imaging Review No results found.   EKG Interpretation None      MDM   Final diagnoses:  Bloating symptom   29 yo with resolving issues of constipation but with sensation of bloating after increased fiber intake.  He denies any abd pain, nausea, or vomiting. He reports mild improvement of symptoms with intermittent use of Miralax and Gas-X. Discussed daily use of miralax to generate regular soft bowel movements and regular use of gas-x.  Also discussed increased water intake and prescription of PPI.  Resources provided to establish care with a PCP and GI if symptoms persist.  He is well-appearing, in no acute distress and vital signs reviewed and not concerning.  He appears safe to be discharged.   Return precautions provided. Pt aware of plan and  in agreement.  Filed Vitals:   10/01/14 2145 10/01/14 2215 10/01/14 2230 10/01/14 2245  BP: 114/72 120/73 110/94 115/75  Pulse: 72 71 72 64  Temp:      TempSrc:      Resp:      Height:      Weight:      SpO2: 100% 100% 100% 98%   Meds given in ED:  Medications - No data to display  Discharge Medication List as of 10/01/2014 11:41 PM    START taking these medications   Details  omeprazole (PRILOSEC) 20 MG capsule Take 1 capsule (20 mg total) by mouth daily., Starting 10/01/2014, Until Discontinued, Print           Harle BattiestElizabeth Luzmaria Devaux, NP 10/02/14 1210  Mirian MoMatthew Gentry, MD 10/04/14 [id number]

## 2014-10-01 NOTE — ED Notes (Signed)
Unable to obtain signature  Due to computer issues in room.

## 2014-10-01 NOTE — ED Notes (Signed)
Pt reports constipation a month a ago. Pt was instructed to take Miralax and increase fiber in his diet. Pt now reports loss of apetite and increase sensation of feeling full when he eats small amounts. Pt reports a loss of 10 lbs in a month. Denies pain, n/v/d.

## 2014-10-01 NOTE — Discharge Instructions (Signed)
Please follow the directions provided.  Use the resource guide or referral to establish care with a primary care doctor. If your symptoms do not resolve may follow-up with the GI doctor for further management. You may take the Miralax 1 capful dissolved in liquid once a day to help create soft bowel movements. He may continue to take the Gas-X as needed for the sensation of bloating. Please start the omeprazole 1 time a day. Don't hesitate to return for any new, worsening or concerning symptoms.   SEEK MEDICAL CARE IF:  Bloating continues and seems to be getting worse.  You notice a weight gain.  You have a weight loss but the bloating is getting worse.  You have changes in your bowel habits or develop nausea or vomiting.    Emergency Department Resource Guide 1) Find a Doctor and Pay Out of Pocket Although you won't have to find out who is covered by your insurance plan, it is a good idea to ask around and get recommendations. You will then need to call the office and see if the doctor you have chosen will accept you as a new patient and what types of options they offer for patients who are self-pay. Some doctors offer discounts or will set up payment plans for their patients who do not have insurance, but you will need to ask so you aren't surprised when you get to your appointment.  2) Contact Your Local Health Department Not all health departments have doctors that can see patients for sick visits, but many do, so it is worth a call to see if yours does. If you don't know where your local health department is, you can check in your phone book. The CDC also has a tool to help you locate your state's health department, and many state websites also have listings of all of their local health departments.  3) Find a Walk-in Clinic If your illness is not likely to be very severe or complicated, you may want to try a walk in clinic. These are popping up all over the country in pharmacies, drugstores,  and shopping centers. They're usually staffed by nurse practitioners or physician assistants that have been trained to treat common illnesses and complaints. They're usually fairly quick and inexpensive. However, if you have serious medical issues or chronic medical problems, these are probably not your best option.  No Primary Care Doctor: - Call Health Connect at  [phone] - they can help you locate a primary care doctor that  accepts your insurance, provides certain services, etc. - Physician Referral Service- [phone]  Chronic Pain Problems: Organization         Address  Phone   Notes  [facility]  [phone] Patients need to be referred by their primary care doctor.   Medication Assistance: Organization         Address  Phone   Notes  [facility] Medication [facility] [address] [place] [place], [place] [address] [phone] --Must be a resident of [facility] -- Must have NO insurance coverage whatsoever (no Medicaid/ Medicare, etc.) -- The pt. MUST have a primary care doctor that directs their care regularly and follows them in the community   [facility]  [phone]   [employer]  [phone]    Agencies that provide inexpensive medical care: Organization         Address  Phone   Notes  [facility]  [phone]  [facility]    [phone]   [facility] [address] [place], [place] [address] [phone]   [facility] [address] [place], [place] [phone]   [facility]anned Parenthood    [phone]   [facility]    [phone]   [facility]  [address], [place] Phone:  [phone], Fax:  [phone] Hours of Operation:  9 am - 6 pm, M-F.  Also accepts Medicaid/Medicare and self-pay.  [facility]  [address], [place] Phone: [phone], Fax: [phone]. Hours of Operation:  8:30 am - 5:30 pm, M-F.  Also accepts Medicaid and self-pay.  [facility] [place] [address] [place] Phone: [phone]   [facility] [address] Natasha Bence [place], [place] [phone], Ext. [phone] Mondays & Thursdays: 7-9 AM.  First 15 patients are seen on a first come, first serve basis.    Medicaid-accepting [facility] Providers:  Organization         Address  Phone   Notes  [facility] [address], [place] [phone] Also accepts self-pay patients.  [facility] [address] Laurell Josephs [place], [place]  [phone]   [facility] [address], [place] [phone]   [facility] [address] [place] [phone]   Renaye Rakers [address], [place]   [phone] Only accepts [place] patients after they have their name applied to their card.   Self-Pay (no insurance) in [facility]:  Organization         Address  Phone   Notes  Sickle Cell Patients, [facility] [address] [place], [place] [phone]   [facility] [address] [place], [place] [phone]   [facility] [place]  [address], [place] [phone]   [facility]/Dr. Osei-Bonsu  [address], [place] or [address], [place] [phone] Phone number for both [place] and [place] locations is the same.  [facility] [address] [place] [phone]   [facility] [address], [place] or [address] [phone]   [facility] [address], [place] [phone], phone; [phone], fax Sees patients 1st and 3rd Saturday of every month.  Must not qualify for public or  private insurance (i.e. Medicaid, Medicare, [place] Health Choice, Veterans' Benefits)  Household income should be no more than 200% of the poverty level The clinic cannot treat you if you are pregnant or think you are pregnant  Sexually transmitted diseases are not treated at the clinic.    Dental Care: Organization         Address  Phone  Notes  [facility] [address] [place], [place] [phone] Accepts children up to age 59 who are enrolled in [place] or [place] Health Choice; pregnant women with a Medicaid card; and children who have applied for Medicaid or [place] Health Choice, but were declined, whose parents can pay a reduced fee at time of service.  [facility]  [address],  [place] [phone] Accepts children up to age 28 who are enrolled in Medicaid or [place] Health Choice; pregnant women with a Medicaid card; and children who have applied for Medicaid or [place] Health Choice, but were declined, whose parents can pay a reduced fee at time of service.  [facility]  [address] [place], [place] [phone] Patients are seen by appointment only. Walk-ins are not accepted. [facility] will see patients 55 years of age and older. Monday - Tuesday (8am-5pm) Most Wednesdays (8:30-5pm) $30 per visit, cash only  [facility]  [address], [facility] [phone] Patients are seen by appointment only. Walk-ins are not accepted. [facility] will see patients 62 years of age and older. One Wednesday Evening (Monthly: Volunteer Based).  $30 per visit, cash only  [employer]  [phone] for adults; Children under age 45, call Graduate Pediatric Dentistry at [phone]. Children aged 38-14, please call [phone] to request a pediatric application.  Dental services are provided in all areas of dental  care including fillings, crowns and bridges, complete and partial dentures, implants, gum treatment, root canals, and extractions. Preventive care is also provided. Treatment is provided to both adults and children. Patients are selected via a lottery and there is often a waiting list.   [facility] [address] [place]  [phone] [email or web address]   [facility] [address] [place], [place] [phone], Ext. [phone] Second and Fourth Thursday of each month, opens at 6:30 AM; Clinic ends at 9 AM.  Patients are seen on a first-come first-served basis, and a limited number are seen during each clinic.   [facility]  [address] Ether Griffins [place], [place] [phone]   Eligibility Requirements You must have lived in [place], [place], or [place] counties for at least the last three months.   You cannot be eligible for state or federal sponsored [place], including [employer], [place], or [employer].   You generally cannot be eligible for healthcare insurance through your employer.    How to apply: Eligibility screenings are held every Tuesday and Wednesday afternoon from 1:00 pm until 4:00 pm. You do not need an appointment for the interview!  [facility] [address] [place], [place] [phone]   [facility] Health Department  [phone]   [facility] Health Department  [phone]   [facility] Health Department  [phone]    Behavioral Health Resources in the Community: Intensive Outpatient Programs Organization         Address  Phone  Notes  [facility] [address], [place], [place] [phone]   [facility] Outpatient [address], [place], [place] [phone]   [facility] [address] [place], [place]  [phone]   [facility] [address],  [place], [place] [phone] or [phone]    Substance Abuse Resources Organization         Address  Phone  Notes  [facility]  [phone]   [facility]  [phone]   [facility] [place]  [phone]   Floydene Flock  [phone]   Residential & Outpatient Substance Abuse Program  [phone]   Psychological Services Organization         Address  Phone  Notes  [facility]  [phone]   [facility]  [phone]   [facility] [address], [place] [phone] or [phone]    Mobile Crisis Teams Organization         Address  Phone  Notes  [facility], Mobile Crisis Care Unit  [phone]   [facility]  [address] [place], [place] [phone]   Doristine LocksSharon DeEsch [address] [place] [phone]    Self-Help/Support Groups Organization         Address  Phone             Notes  [facility] [place] - variety of support groups  [phone]- [phone] Call for more information  Narcotics Anonymous (NA), [facility] [address], [place]  2 meetings at this location   Statisticianesidential Treatment Programs Organization         Address  Phone  Notes  [facility] [address],    [place]  [phone]   [facility]  [address], [place] [phone], [place], [place] [phone]   [facility] [address], [place] [phone] Admissions: 8am-3pm M-F  [facility] [address]    [place], [place] [phone]   [facility] [address], [place], [place] [phone]   [facility] [address]  [place], [place] [phone]   [facility] - Intensive Outpatient [address], [place], [place] [phone]   [facility] ([facility] [address]  [place], [place] [phone] or [phone]   [facility]  Services (RT[facility]) [address] [place], [place] [phone] Accepts Medicaid  [facility] [address]  [place] [phone] Substance Abuse/Addiction Treatment   [facility] Behavioral Health Resources Organization         Address  Phone  Notes  [facility]  [phone]   Angie FavaJulie Brannon, PhD [address], [address] [place], [place]   [phone] or [phone]   [facility]   [address] [place], [place] [phone]) [phone]   [facility] [address], [place], [place] [phone] Insurance/Medicaid/sponsorship through [facility]  [facility] [address]                                    [place], [place] [phone] Therapy/tele-psych/case  [facility] [address]   [place], [place] [phone]    Dr. Lolly MustacheArfeen  [phone]   [facility]  [employer] [facility] 1) [address], [place] 2) [address], [place] 3)  [address], [place] [phone]  [phone]   [facility] Child Abuse Hotline [phone]) [phone] or [phone] (After Hours)

## 2014-10-04 ENCOUNTER — Encounter (HOSPITAL_COMMUNITY): Payer: Self-pay | Admitting: Emergency Medicine

## 2014-10-04 DIAGNOSIS — J45909 Unspecified asthma, uncomplicated: Secondary | ICD-10-CM | POA: Insufficient documentation

## 2014-10-04 DIAGNOSIS — Z87891 Personal history of nicotine dependence: Secondary | ICD-10-CM | POA: Insufficient documentation

## 2014-10-04 DIAGNOSIS — K649 Unspecified hemorrhoids: Secondary | ICD-10-CM | POA: Insufficient documentation

## 2014-10-04 DIAGNOSIS — Z79899 Other long term (current) drug therapy: Secondary | ICD-10-CM | POA: Insufficient documentation

## 2014-10-04 NOTE — ED Notes (Signed)
Pt dx with hemorrhoids in the last week- pt presents with ongoing rectal pain.  Admits to difficulty with bowel movements and ambulation- reports cream is not helping.

## 2014-10-05 ENCOUNTER — Emergency Department (HOSPITAL_COMMUNITY)
Admission: EM | Admit: 2014-10-05 | Discharge: 2014-10-05 | Disposition: A | Discharge status: Home / Self Care | Payer: 59 | Attending: Emergency Medicine | Admitting: Emergency Medicine

## 2014-10-05 DIAGNOSIS — K649 Unspecified hemorrhoids: Secondary | ICD-10-CM

## 2014-10-05 MED ORDER — LIDOCAINE (ANORECTAL) 5 % EX GEL: 1.0000 "application " | Freq: Two times a day (BID) | CUTANEOUS | Status: DC

## 2014-10-05 MED ORDER — DOCUSATE SODIUM 100 MG PO CAPS: 100.0000 mg | ORAL_CAPSULE | Freq: Two times a day (BID) | ORAL | Status: DC

## 2014-10-05 MED ORDER — HYDROCODONE-ACETAMINOPHEN 5-325 MG PO TABS: 1.0000 | ORAL_TABLET | Freq: Four times a day (QID) | ORAL | Status: DC | PRN

## 2014-10-05 NOTE — Discharge Instructions (Signed)
Hemorrhoids Hemorrhoids are swollen veins around the rectum or anus. There are two types of hemorrhoids:  1. Internal hemorrhoids. These occur in the veins just inside the rectum. They may poke through to the outside and become irritated and painful. 2. External hemorrhoids. These occur in the veins outside the anus and can be felt as a painful swelling or hard lump near the anus. CAUSES  Pregnancy.   Obesity.   Constipation or diarrhea.   Straining to have a bowel movement.   Sitting for long periods on the toilet.  Heavy lifting or other activity that caused you to strain.  Anal intercourse. SYMPTOMS   Pain.   Anal itching or irritation.   Rectal bleeding.   Fecal leakage.   Anal swelling.   One or more lumps around the anus.  DIAGNOSIS  Your caregiver may be able to diagnose hemorrhoids by visual examination. Other examinations or tests that may be performed include:   Examination of the rectal area with a gloved hand (digital rectal exam).   Examination of anal canal using a small tube (scope).   A blood test if you have lost a significant amount of blood.  A test to look inside the colon (sigmoidoscopy or colonoscopy). TREATMENT Most hemorrhoids can be treated at home. However, if symptoms do not seem to be getting better or if you have a lot of rectal bleeding, your caregiver may perform a procedure to help make the hemorrhoids get smaller or remove them completely. Possible treatments include:   Placing a rubber band at the base of the hemorrhoid to cut off the circulation (rubber band ligation).   Injecting a chemical to shrink the hemorrhoid (sclerotherapy).   Using a tool to burn the hemorrhoid (infrared light therapy).   Surgically removing the hemorrhoid (hemorrhoidectomy).   Stapling the hemorrhoid to block blood flow to the tissue (hemorrhoid stapling).  HOME CARE INSTRUCTIONS   Eat foods with fiber, such as whole grains, beans,  nuts, fruits, and vegetables. Ask your doctor about taking products with added fiber in them (fibersupplements).  Increase fluid intake. Drink enough water and fluids to keep your urine clear or pale yellow.   Exercise regularly.   Go to the bathroom when you have the urge to have a bowel movement. Do not wait.   Avoid straining to have bowel movements.   Keep the anal area dry and clean. Use wet toilet paper or moist towelettes after a bowel movement.   Medicated creams and suppositories may be used or applied as directed.   Only take over-the-counter or prescription medicines as directed by your caregiver.   Take warm sitz baths for 15-20 minutes, 3-4 times a day to ease pain and discomfort.   Place ice packs on the hemorrhoids if they are tender and swollen. Using ice packs between sitz baths may be helpful.   Put ice in a plastic bag.   Place a towel between your skin and the bag.   Leave the ice on for 15-20 minutes, 3-4 times a day.   Do not use a donut-shaped pillow or sit on the toilet for long periods. This increases blood pooling and pain.  SEEK MEDICAL CARE IF:  You have increasing pain and swelling that is not controlled by treatment or medicine.  You have uncontrolled bleeding.  You have difficulty or you are unable to have a bowel movement.  You have pain or inflammation outside the area of the hemorrhoids. MAKE SURE YOU:  Understand these instructions.    Will watch your condition.  Will get help right away if you are not doing well or get worse. Document Released: 08/22/2000 Document Revised: 08/11/2012 Document Reviewed: 06/29/2012 ExitCare Patient Information 2015 ExitCare[employer] LLC. This information is not intended to replace advice given to you by your health care provider. Make sure you discuss any questions you have with your health care provider. Sitz Bath A sitz bath is a warm water bath taken in the sitting position that covers only the  hips and buttocks. It may be used for either healing or hygiene purposes. Sitz baths are also used to relieve pain, itching, or muscle spasms. The water may contain medicine. Moist heat will help you heal and relax.  HOME CARE INSTRUCTIONS  Take 3 to 4 sitz baths a day. 3. Fill the bathtub half full with warm water. 4. Sit in the water and open the drain a little. 5. Turn on the warm water to keep the tub half full. Keep the water running constantly. 6. Soak in the water for 15 to 20 minutes. 7. After the sitz bath, pat the affected area dry first. SEEK MEDICAL CARE IF:  You get worse instead of better. Stop the sitz baths if you get worse. MAKE SURE YOU:  Understand these instructions.  Will watch your condition.  Will get help right away if you are not doing well or get worse. Document Released: 05/17/2004 Document Revised: 05/19/2012 Document Reviewed: 11/22/2010 ExitCare Patient Information 2015 ExitCare, LLC. This information is not intended to replace advice given to you by your health care provider. Make sure you discuss any questions you have with your health care provider.  

## 2014-10-06 NOTE — ED Provider Notes (Signed)
CSN: [id number]     Arrival date & time 10/04/14  2340 History   First MD Initiated Contact with Patient 10/05/14 0258     Chief Complaint  Patient presents with  . Rectal Bleeding     (Consider location/radiation/quality/duration/timing/severity/associated sxs/prior Treatment) HPI Comments: Pt with hx of hemorrhoids comes in with cc of rectal pain. Recently found to have hemorrhoid. Has hx of constipation. Now having pain in the rectal region when he is sitting, with some blood when he wipes. No pus. No n/v/f/c. No trauma.  Patient is a 29 y.o. male presenting with hematochezia. The history is provided by the patient.  Rectal Bleeding Associated symptoms: no fever     Past Medical History  Diagnosis Date  . Asthma    History reviewed. No pertinent past surgical history. No family history on file. History  Substance Use Topics  . Smoking status: Former Games developermoker  . Smokeless tobacco: Not on file  . Alcohol Use: No    Review of Systems  Constitutional: Negative for fever.  Gastrointestinal: Positive for hematochezia, anal bleeding and rectal pain.  Allergic/Immunologic: Negative for immunocompromised state.  Hematological: Does not bruise/bleed easily.      Allergies  Review of patient's allergies indicates no known allergies.  Home Medications   Prior to Admission medications   Medication Sig Start Date End Date Taking? Authorizing Provider  amoxicillin (AMOXIL) 500 MG capsule Take 2 capsules (1,000 mg total) by mouth 2 (two) times daily. Patient not taking: Reported on 07/22/2014 10/21/13   Rodolph BongEvan S Corey, MD  docusate sodium (COLACE) 100 MG capsule Take 1 capsule (100 mg total) by mouth every 12 (twelve) hours. 10/05/14   Derwood KaplanAnkit Kamil Mchaffie, MD  guaiFENesin-codeine 100-10 MG/5ML syrup Take 5 mLs by mouth at bedtime as needed for cough. Patient not taking: Reported on 07/22/2014 10/21/13   Rodolph BongEvan S Corey, MD  HYDROcodone-acetaminophen (NORCO/VICODIN) 5-325 MG per tablet Take 1  tablet by mouth every 6 (six) hours as needed. 10/05/14   Derwood KaplanAnkit Rena Hunke, MD  ibuprofen (ADVIL,MOTRIN) 600 MG tablet Take 1 tablet (600 mg total) by mouth every 6 (six) hours as needed for moderate pain. Patient not taking: Reported on 10/01/2014 07/22/14   Loren Raceravid Yelverton, MD  ipratropium (ATROVENT) 0.06 % nasal spray Place 2 sprays into both nostrils 4 (four) times daily. Patient not taking: Reported on 07/22/2014 10/21/13   Rodolph BongEvan S Corey, MD  Lidocaine, Anorectal, 5 % GEL Apply 1 application topically 2 (two) times daily. 10/05/14   Derwood KaplanAnkit Benino Korinek, MD  loratadine (CLARITIN) 10 MG tablet Take 1 tablet (10 mg total) by mouth daily. Patient not taking: Reported on 10/01/2014 07/22/14   Loren Raceravid Yelverton, MD  methocarbamol (ROBAXIN) 500 MG tablet Take 1 tablet (500 mg total) by mouth 2 (two) times daily. Patient not taking: Reported on 10/01/2014 07/22/14   Loren Raceravid Yelverton, MD  omeprazole (PRILOSEC) 20 MG capsule Take 1 capsule (20 mg total) by mouth daily. 10/01/14   Harle BattiestElizabeth Tysinger, NP  polyethylene glycol powder (GLYCOLAX/MIRALAX) powder Take 17 g by mouth daily as needed (constipation).  09/27/14   Historical Provider, MD  predniSONE (DELTASONE) 10 MG tablet Take 3 tablets (30 mg total) by mouth daily. Patient not taking: Reported on 07/22/2014 10/21/13   Rodolph BongEvan S Corey, MD  Simethicone (GAS-X PO) Take 1-2 tablets by mouth 2 (two) times daily as needed (gas build up).    Historical Provider, MD  triamcinolone cream (KENALOG) 0.1 % Apply 1 application topically 2 (two) times daily as needed (swelling).  09/27/14   Historical Provider, MD   BP 117/68 mmHg  Pulse 63  Temp(Src) 99 F (37.2 C) (Oral)  Resp 19  Ht  (1.753 m)  Wt 130 lb (58.968 kg)  BMI 19.19 kg/m2  SpO2 100% Physical Exam  Constitutional: He appears well-developed.  HENT:  Head: Atraumatic.  Eyes: Conjunctivae are normal.  Neck: Neck supple.  Cardiovascular: Normal rate.   Pulmonary/Chest: Effort normal.  Abdominal:  +  hemorrhoid, with no active bleeding, and no signs of thrombosis and no fluctuance or drainage.  Nursing note and vitals reviewed.   ED Course  Procedures (including critical care time) Labs Review Labs Reviewed - No data to display  Imaging Review No results found.   EKG Interpretation None      MDM   Final diagnoses:  Hemorrhoids, unspecified hemorrhoid type    Pt has a hemorrhoid, not thrombosed or infected. Will get him some meds for relief, and stool softener. Advised SITZ bath. Surgery # provided, in case sx are getting worse.  Derwood Kaplan, MD 10/06/14 [phone]

## 2014-12-18 ENCOUNTER — Emergency Department (HOSPITAL_COMMUNITY)
Admission: EM | Admit: 2014-12-18 | Discharge: 2014-12-19 | Disposition: A | Discharge status: Home / Self Care | Payer: 59 | Attending: Emergency Medicine | Admitting: Emergency Medicine

## 2014-12-18 ENCOUNTER — Encounter (HOSPITAL_COMMUNITY): Payer: Self-pay | Admitting: Emergency Medicine

## 2014-12-18 DIAGNOSIS — Y9389 Activity, other specified: Secondary | ICD-10-CM | POA: Insufficient documentation

## 2014-12-18 DIAGNOSIS — S3991XA Unspecified injury of abdomen, initial encounter: Secondary | ICD-10-CM | POA: Insufficient documentation

## 2014-12-18 DIAGNOSIS — Y92410 Unspecified street and highway as the place of occurrence of the external cause: Secondary | ICD-10-CM | POA: Insufficient documentation

## 2014-12-18 DIAGNOSIS — S0990XA Unspecified injury of head, initial encounter: Secondary | ICD-10-CM | POA: Insufficient documentation

## 2014-12-18 DIAGNOSIS — Y998 Other external cause status: Secondary | ICD-10-CM | POA: Insufficient documentation

## 2014-12-18 DIAGNOSIS — J45909 Unspecified asthma, uncomplicated: Secondary | ICD-10-CM | POA: Insufficient documentation

## 2014-12-18 DIAGNOSIS — S29001A Unspecified injury of muscle and tendon of front wall of thorax, initial encounter: Secondary | ICD-10-CM | POA: Insufficient documentation

## 2014-12-18 NOTE — ED Notes (Signed)
Unable to locate patient when called for room, seen by registration leaving ED about 30 min ago and has not returned yet
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
## 2015-03-01 ENCOUNTER — Encounter (HOSPITAL_COMMUNITY): Payer: Self-pay

## 2015-03-01 ENCOUNTER — Emergency Department (HOSPITAL_COMMUNITY)
Admission: EM | Admit: 2015-03-01 | Discharge: 2015-03-01 | Disposition: A | Discharge status: Home / Self Care | Payer: Worker's Compensation | Attending: Emergency Medicine | Admitting: Emergency Medicine

## 2015-03-01 ENCOUNTER — Emergency Department (HOSPITAL_COMMUNITY): Payer: Worker's Compensation

## 2015-03-01 DIAGNOSIS — Y990 Civilian activity done for income or pay: Secondary | ICD-10-CM | POA: Diagnosis not present

## 2015-03-01 DIAGNOSIS — W208XXA Other cause of strike by thrown, projected or falling object, initial encounter: Secondary | ICD-10-CM | POA: Diagnosis not present

## 2015-03-01 DIAGNOSIS — Y929 Unspecified place or not applicable: Secondary | ICD-10-CM | POA: Diagnosis not present

## 2015-03-01 DIAGNOSIS — Z87891 Personal history of nicotine dependence: Secondary | ICD-10-CM | POA: Insufficient documentation

## 2015-03-01 DIAGNOSIS — J45909 Unspecified asthma, uncomplicated: Secondary | ICD-10-CM | POA: Insufficient documentation

## 2015-03-01 DIAGNOSIS — S99922A Unspecified injury of left foot, initial encounter: Secondary | ICD-10-CM | POA: Diagnosis present

## 2015-03-01 DIAGNOSIS — S9032XA Contusion of left foot, initial encounter: Secondary | ICD-10-CM | POA: Insufficient documentation

## 2015-03-01 DIAGNOSIS — Z791 Long term (current) use of non-steroidal anti-inflammatories (NSAID): Secondary | ICD-10-CM | POA: Diagnosis not present

## 2015-03-01 DIAGNOSIS — Y9389 Activity, other specified: Secondary | ICD-10-CM | POA: Insufficient documentation

## 2015-03-01 MED ORDER — OXYCODONE HCL 5 MG PO TABS: 5.0000 mg | ORAL_TABLET | ORAL | Status: AC | PRN

## 2015-03-01 MED ORDER — IBUPROFEN 600 MG PO TABS: 600.0000 mg | ORAL_TABLET | Freq: Four times a day (QID) | ORAL | Status: DC | PRN

## 2015-03-01 NOTE — ED Notes (Signed)
Pt requested and given ice pack for foot pain. In addition, this RN offered percocet per protocol and pt refused, stating "I will be fasting in a couple hours and don't want any medications for pain."

## 2015-03-01 NOTE — Discharge Instructions (Signed)
Your xrays today did not show any signs of broken bones.  The bruising and pain will improve over the next week.  Keep foot elevated and wrapped to help with pain.  No weight bearing for at least 3 days.  Ice to help with swelling and pain.  Take medications as prescribed.  Return to the emergency department for worsening condition or new concerning symptoms.   Cryotherapy Cryotherapy means treatment with cold. Ice or gel packs can be used to reduce both pain and swelling. Ice is the most helpful within the first 24 to 48 hours after an injury or flare-up from overusing a muscle or joint. Sprains, strains, spasms, burning pain, shooting pain, and aches can all be eased with ice. Ice can also be used when recovering from surgery. Ice is effective, has very few side effects, and is safe for most people to use. PRECAUTIONS  Ice is not a safe treatment option for people with:  Raynaud phenomenon. This is a condition affecting small blood vessels in the extremities. Exposure to cold may cause your problems to return.  Cold hypersensitivity. There are many forms of cold hypersensitivity, including:  Cold urticaria. Red, itchy hives appear on the skin when the tissues begin to warm after being iced.  Cold erythema. This is a red, itchy rash caused by exposure to cold.  Cold hemoglobinuria. Red blood cells break down when the tissues begin to warm after being iced. The hemoglobin that carry oxygen are passed into the urine because they cannot combine with blood proteins fast enough.  Numbness or altered sensitivity in the area being iced. If you have any of the following conditions, do not use ice until you have discussed cryotherapy with your caregiver:  Heart conditions, such as arrhythmia, angina, or chronic heart disease.  High blood pressure.  Healing wounds or open skin in the area being iced.  Current infections.  Rheumatoid arthritis.  Poor circulation.  Diabetes. Ice slows the  blood flow in the region it is applied. This is beneficial when trying to stop inflamed tissues from spreading irritating chemicals to surrounding tissues. However, if you expose your skin to cold temperatures for too long or without the proper protection, you can damage your skin or nerves. Watch for signs of skin damage due to cold. HOME CARE INSTRUCTIONS Follow these tips to use ice and cold packs safely.  Place a dry or damp towel between the ice and skin. A damp towel will cool the skin more quickly, so you may need to shorten the time that the ice is used.  For a more rapid response, add gentle compression to the ice.  Ice for no more than 10 to 20 minutes at a time. The bonier the area you are icing, the less time it will take to get the benefits of ice.  Check your skin after 5 minutes to make sure there are no signs of a poor response to cold or skin damage.  Rest 20 minutes or more between uses.  Once your skin is numb, you can end your treatment. You can test numbness by very lightly touching your skin. The touch should be so light that you do not see the skin dimple from the pressure of your fingertip. When using ice, most people will feel these normal sensations in this order: cold, burning, aching, and numbness.  Do not use ice on someone who cannot communicate their responses to pain, such as small children or people with dementia. HOW TO MAKE  AN ICE PACK Ice packs are the most common way to use ice therapy. Other methods include ice massage, ice baths, and cryosprays. Muscle creams that cause a cold, tingly feeling do not offer the same benefits that ice offers and should not be used as a substitute unless recommended by your caregiver. To make an ice pack, do one of the following:  Place crushed ice or a bag of frozen vegetables in a sealable plastic bag. Squeeze out the excess air. Place this bag inside another plastic bag. Slide the bag into a pillowcase or place a damp towel  between your skin and the bag.  Mix 3 parts water with 1 part rubbing alcohol. Freeze the mixture in a sealable plastic bag. When you remove the mixture from the freezer, it will be slushy. Squeeze out the excess air. Place this bag inside another plastic bag. Slide the bag into a pillowcase or place a damp towel between your skin and the bag. SEEK MEDICAL CARE IF:  You develop white spots on your skin. This may give the skin a blotchy (mottled) appearance.  Your skin turns blue or pale.  Your skin becomes waxy or hard.  Your swelling gets worse. MAKE SURE YOU:   Understand these instructions.  Will watch your condition.  Will get help right away if you are not doing well or get worse. Document Released: 04/21/2011 Document Revised: 01/09/2014 Document Reviewed: 04/21/2011 [facility] Patient Information 2015 [place], [place]. This information is not intended to replace advice given to you by your health care provider. Make sure you discuss any questions you have with your health care provider.  Contusion A contusion is a deep bruise. Contusions are the result of an injury that caused bleeding under the skin. The contusion may turn blue, purple, or yellow. Minor injuries will give you a painless contusion, but more severe contusions may stay painful and swollen for a few weeks.  CAUSES  A contusion is usually caused by a blow, trauma, or direct force to an area of the body. SYMPTOMS   Swelling and redness of the injured area.  Bruising of the injured area.  Tenderness and soreness of the injured area.  Pain. DIAGNOSIS  The diagnosis can be made by taking a history and physical exam. An X-ray, CT scan, or MRI may be needed to determine if there were any associated injuries, such as fractures. TREATMENT  Specific treatment will depend on what area of the body was injured. In general, the best treatment for a contusion is resting, icing, elevating, and applying cold compresses to the  injured area. Over-the-counter medicines may also be recommended for pain control. Ask your caregiver what the best treatment is for your contusion. HOME CARE INSTRUCTIONS   Put ice on the injured area.  Put ice in a plastic bag.  Place a towel between your skin and the bag.  Leave the ice on for 15-20 minutes, 3-4 times a day, or as directed by your health care provider.  Only take over-the-counter or prescription medicines for pain, discomfort, or fever as directed by your caregiver. Your caregiver may recommend avoiding anti-inflammatory medicines (aspirin, ibuprofen, and naproxen) for 48 hours because these medicines may increase bruising.  Rest the injured area.  If possible, elevate the injured area to reduce swelling. SEEK IMMEDIATE MEDICAL CARE IF:   You have increased bruising or swelling.  You have pain that is getting worse.  Your swelling or pain is not relieved with medicines. MAKE SURE YOU:  Understand these instructions.  Will watch your condition.  Will get help right away if you are not doing well or get worse. Document Released: 06/04/2005 Document Revised: 08/30/2013 Document Reviewed: 06/30/2011 [facility] Patient Information 2015 [place], [place]. This information is not intended to replace advice given to you by your health care provider. Make sure you discuss any questions you have with your health care provider.  Hematoma A hematoma is a collection of blood under the skin, in an organ, in a body space, in a joint space, or in other tissue. The blood can clot to form a lump that you can see and feel. The lump is often firm and may sometimes become sore and tender. Most hematomas get better in a few days to weeks. However, some hematomas may be serious and require medical care. Hematomas can range in size from very small to very large. CAUSES  A hematoma can be caused by a blunt or penetrating injury. It can also be caused by spontaneous leakage from a blood  vessel under the skin. Spontaneous leakage from a blood vessel is more likely to occur in older people, especially those taking blood thinners. Sometimes, a hematoma can develop after certain medical procedures. SIGNS AND SYMPTOMS   A firm lump on the body.  Possible pain and tenderness in the area.  Bruising.Blue, dark blue, purple-red, or yellowish skin may appear at the site of the hematoma if the hematoma is close to the surface of the skin. For hematomas in deeper tissues or body spaces, the signs and symptoms may be subtle. For example, an intra-abdominal hematoma may cause abdominal pain, weakness, fainting, and shortness of breath. An intracranial hematoma may cause a headache or symptoms such as weakness, trouble speaking, or a change in consciousness. DIAGNOSIS  A hematoma can usually be diagnosed based on your medical history and a physical exam. Imaging tests may be needed if your health care provider suspects a hematoma in deeper tissues or body spaces, such as the abdomen, head, or chest. These tests may include ultrasonography or a CT scan.  TREATMENT  Hematomas usually go away on their own over time. Rarely does the blood need to be drained out of the body. Large hematomas or those that may affect vital organs will sometimes need surgical drainage or monitoring. HOME CARE INSTRUCTIONS   Apply ice to the injured area:   Put ice in a plastic bag.   Place a towel between your skin and the bag.   Leave the ice on for 20 minutes, 2-3 times a day for the first 1 to 2 days.   After the first 2 days, switch to using warm compresses on the hematoma.   Elevate the injured area to help decrease pain and swelling. Wrapping the area with an elastic bandage may also be helpful. Compression helps to reduce swelling and promotes shrinking of the hematoma. Make sure the bandage is not wrapped too tight.   If your hematoma is on a lower extremity and is painful, crutches may be  helpful for a couple days.   Only take over-the-counter or prescription medicines as directed by your health care provider. SEEK IMMEDIATE MEDICAL CARE IF:   You have increasing pain, or your pain is not controlled with medicine.   You have a fever.   You have worsening swelling or discoloration.   Your skin over the hematoma breaks or starts bleeding.   Your hematoma is in your chest or abdomen and you have weakness, shortness of  breath, or a change in consciousness.  Your hematoma is on your scalp (caused by a fall or injury) and you have a worsening headache or a change in alertness or consciousness. MAKE SURE YOU:   Understand these instructions.  Will watch your condition.  Will get help right away if you are not doing well or get worse. Document Released: 04/08/2004 Document Revised: 04/27/2013 Document Reviewed: 02/02/2013 [facility] Patient Information 2015 [place], [place]. This information is not intended to replace advice given to you by your health care provider. Make sure you discuss any questions you have with your health care provider.

## 2015-03-01 NOTE — ED Notes (Signed)
Pt was at work, pallet fell on left foot. Left foot swollen, warm, dry, cap refill <3 sec, pulse strong.

## 2015-03-01 NOTE — ED Provider Notes (Signed)
CSN: [id number]     Arrival date & time 03/01/15  0350 History   First MD Initiated Contact with Patient 03/01/15 [phone]     Chief Complaint  Patient presents with  . Foot Pain     (Consider location/radiation/quality/duration/timing/severity/associated sxs/prior Treatment) HPI 29 year old male presents to emergency department from work with complaint of left foot pain.  Patient was at work when a heavy pallet landed on his foot.  Incident occurred around 10 PM.  He continued to work, but noted increased swelling throughout the shift until he was no longer able to bear weight secondary to pain. Past Medical History  Diagnosis Date  . Asthma    History reviewed. No pertinent past surgical history. History reviewed. No pertinent family history. History  Substance Use Topics  . Smoking status: Former Games developer  . Smokeless tobacco: Not on file  . Alcohol Use: No    Review of Systems  See History of Present Illness; otherwise all other systems are reviewed and negative   Allergies  Tylenol  Home Medications   Prior to Admission medications   Medication Sig Start Date End Date Taking? Authorizing Provider  amoxicillin (AMOXIL) 500 MG capsule Take 2 capsules (1,000 mg total) by mouth 2 (two) times daily. Patient not taking: Reported on 07/22/2014 10/21/13   Rodolph Bong, MD  docusate sodium (COLACE) 100 MG capsule Take 1 capsule (100 mg total) by mouth every 12 (twelve) hours. Patient not taking: Reported on 12/18/2014 10/05/14   Derwood Kaplan, MD  guaiFENesin-codeine 100-10 MG/5ML syrup Take 5 mLs by mouth at bedtime as needed for cough. Patient not taking: Reported on 07/22/2014 10/21/13   Rodolph Bong, MD  HYDROcodone-acetaminophen (NORCO/VICODIN) 5-325 MG per tablet Take 1 tablet by mouth every 6 (six) hours as needed. Patient not taking: Reported on 12/18/2014 10/05/14   Derwood Kaplan, MD  ibuprofen (ADVIL,MOTRIN) 600 MG tablet Take 1 tablet (600 mg total) by mouth every 6 (six)  hours as needed for moderate pain. Patient not taking: Reported on 10/01/2014 07/22/14   Loren Racer, MD  ipratropium (ATROVENT) 0.06 % nasal spray Place 2 sprays into both nostrils 4 (four) times daily. Patient not taking: Reported on 07/22/2014 10/21/13   Rodolph Bong, MD  Lidocaine, Anorectal, 5 % GEL Apply 1 application topically 2 (two) times daily. Patient not taking: Reported on 12/18/2014 10/05/14   Derwood Kaplan, MD  loratadine (CLARITIN) 10 MG tablet Take 1 tablet (10 mg total) by mouth daily. Patient not taking: Reported on 10/01/2014 07/22/14   Loren Racer, MD  meloxicam (MOBIC) 15 MG tablet Take 15 mg by mouth daily.    Historical Provider, MD  methocarbamol (ROBAXIN) 500 MG tablet Take 1 tablet (500 mg total) by mouth 2 (two) times daily. Patient not taking: Reported on 10/01/2014 07/22/14   Loren Racer, MD  omeprazole (PRILOSEC) 20 MG capsule Take 1 capsule (20 mg total) by mouth daily. Patient not taking: Reported on 12/18/2014 10/01/14   Harle Battiest, NP  polyethylene glycol powder (GLYCOLAX/MIRALAX) powder Take 17 g by mouth daily as needed (constipation).  09/27/14   Historical Provider, MD  predniSONE (DELTASONE) 10 MG tablet Take 3 tablets (30 mg total) by mouth daily. Patient not taking: Reported on 07/22/2014 10/21/13   Rodolph Bong, MD  Simethicone (GAS-X PO) Take 1-2 tablets by mouth 2 (two) times daily as needed (gas build up).    Historical Provider, MD  triamcinolone cream (KENALOG) 0.1 % Apply 1 application topically 2 (two) times daily  as needed (swelling).  09/27/14   Historical Provider, MD   BP 116/91 mmHg  Pulse 60  Temp(Src) 98.5 F (36.9 C) (Oral)  Resp 16  SpO2 100% Physical Exam  Musculoskeletal:  Patient has bruising to the medial aspect of the arch of the foot, he has soft tissue swelling and pain to the dorsal surface of the foot.  Pulses are intact.  Patient is able to move toes.  Cap refill less than 2 seconds.  No step-off or crepitus     ED Course  Procedures (including critical care time) Labs Review Labs Reviewed - No data to display  Imaging Review Dg Foot Complete Left  03/01/2015   CLINICAL DATA:  Acute onset of left foot pain. Pallet fell on left foot. Initial encounter.  EXAM: LEFT FOOT - COMPLETE 3+ VIEW  COMPARISON:  None.  FINDINGS: There is no evidence of fracture or dislocation. The joint spaces are preserved. There is no evidence of talar subluxation; the subtalar joint is unremarkable in appearance. There is a bipartite lateral sesamoid of the first toe. An os peroneum is noted.  No significant soft tissue abnormalities are seen.  IMPRESSION: 1. No evidence of fracture or dislocation. 2. Bipartite lateral sesamoid of the first toe. 3. Os peroneum seen.   Electronically Signed   By: Roanna Raider M.D.   On: 03/01/2015 06:02     EKG Interpretation None      MDM   Final diagnoses:  Foot contusion, left, initial encounter    29 year old male with contusion to left foot.  X-rays are negative.  Will Ace wrap, rice therapy, plan for discharge home  Marisa Severin, MD 03/01/15 [phone]

## 2015-11-26 ENCOUNTER — Encounter (HOSPITAL_COMMUNITY): Payer: Self-pay | Admitting: Emergency Medicine

## 2015-11-26 ENCOUNTER — Emergency Department (INDEPENDENT_AMBULATORY_CARE_PROVIDER_SITE_OTHER)
Admission: EM | Admit: 2015-11-26 | Discharge: 2015-11-26 | Disposition: A | Discharge status: Home / Self Care | Payer: Managed Care, Other (non HMO) | Source: Home / Self Care | Attending: Family Medicine | Admitting: Family Medicine

## 2015-11-26 DIAGNOSIS — J069 Acute upper respiratory infection, unspecified: Secondary | ICD-10-CM | POA: Diagnosis not present

## 2015-11-26 DIAGNOSIS — R110 Nausea: Secondary | ICD-10-CM | POA: Diagnosis not present

## 2015-11-26 MED ORDER — ONDANSETRON HCL 4 MG PO TABS: 4.0000 mg | ORAL_TABLET | Freq: Four times a day (QID) | ORAL | Status: DC

## 2015-11-26 NOTE — Discharge Instructions (Signed)
Upper Respiratory Infection, Adult °Most upper respiratory infections (URIs) are a viral infection of the air passages leading to the lungs. A URI affects the nose, throat, and upper air passages. The most common type of URI is nasopharyngitis and is typically referred to as "the common cold." °URIs run their course and usually go away on their own. Most of the time, a URI does not require medical attention, but sometimes a bacterial infection in the upper airways can follow a viral infection. This is called a secondary infection. Sinus and middle ear infections are common types of secondary upper respiratory infections. °Bacterial pneumonia can also complicate a URI. A URI can worsen asthma and chronic obstructive pulmonary disease (COPD). Sometimes, these complications can require emergency medical care and may be life threatening.  °CAUSES °Almost all URIs are caused by viruses. A virus is a type of germ and can spread from one person to another.  °RISKS FACTORS °You may be at risk for a URI if:  °· You smoke.   °· You have chronic heart or lung disease. °· You have a weakened defense (immune) system.   °· You are very young or very old.   °· You have nasal allergies or asthma. °· You work in crowded or poorly ventilated areas. °· You work in health care facilities or schools. °SIGNS AND SYMPTOMS  °Symptoms typically develop 2-3 days after you come in contact with a cold virus. Most viral URIs last 7-10 days. However, viral URIs from the influenza virus (flu virus) can last 14-18 days and are typically more severe. Symptoms may include:  °· Runny or stuffy (congested) nose.   °· Sneezing.   °· Cough.   °· Sore throat.   °· Headache.   °· Fatigue.   °· Fever.   °· Loss of appetite.   °· Pain in your forehead, behind your eyes, and over your cheekbones (sinus pain). °· Muscle aches.   °DIAGNOSIS  °Your health care provider may diagnose a URI by: °· Physical exam. °· Tests to check that your symptoms are not due to  another condition such as: °· Strep throat. °· Sinusitis. °· Pneumonia. °· Asthma. °TREATMENT  °A URI goes away on its own with time. It cannot be cured with medicines, but medicines may be prescribed or recommended to relieve symptoms. Medicines may help: °· Reduce your fever. °· Reduce your cough. °· Relieve nasal congestion. °HOME CARE INSTRUCTIONS  °· Take medicines only as directed by your health care provider.   °· Gargle warm saltwater or take cough drops to comfort your throat as directed by your health care provider. °· Use a warm mist humidifier or inhale steam from a shower to increase air moisture. This may make it easier to breathe. °· Drink enough fluid to keep your urine clear or pale yellow.   °· Eat soups and other clear broths and maintain good nutrition.   °· Rest as needed.   °· Return to work when your temperature has returned to normal or as your health care provider advises. You may need to stay home longer to avoid infecting others. You can also use a face mask and careful hand washing to prevent spread of the virus. °· Increase the usage of your inhaler if you have asthma.   °· Do not use any tobacco products, including cigarettes, chewing tobacco, or electronic cigarettes. If you need help quitting, ask your health care provider. °PREVENTION  °The best way to protect yourself from getting a cold is to practice good hygiene.  °· Avoid oral or hand contact with people with cold   symptoms.   °· Wash your hands often if contact occurs.   °There is no clear evidence that vitamin C, vitamin E, echinacea, or exercise reduces the chance of developing a cold. However, it is always recommended to get plenty of rest, exercise, and practice good nutrition.  °SEEK MEDICAL CARE IF:  °· You are getting worse rather than better.   °· Your symptoms are not controlled by medicine.   °· You have chills. °· You have worsening shortness of breath. °· You have brown or red mucus. °· You have yellow or brown nasal  discharge. °· You have pain in your face, especially when you bend forward. °· You have a fever. °· You have swollen neck glands. °· You have pain while swallowing. °· You have white areas in the back of your throat. °SEEK IMMEDIATE MEDICAL CARE IF:  °· You have severe or persistent: °¨ Headache. °¨ Ear pain. °¨ Sinus pain. °¨ Chest pain. °· You have chronic lung disease and any of the following: °¨ Wheezing. °¨ Prolonged cough. °¨ Coughing up blood. °¨ A change in your usual mucus. °· You have a stiff neck. °· You have changes in your: °¨ Vision. °¨ Hearing. °¨ Thinking. °¨ Mood. °MAKE SURE YOU:  °· Understand these instructions. °· Will watch your condition. °· Will get help right away if you are not doing well or get worse. °  °This information is not intended to replace advice given to you by your health care provider. Make sure you discuss any questions you have with your health care provider. °  °Document Released: 02/18/2001 Document Revised: 01/09/2015 Document Reviewed: 11/30/2013 °Elsevier Interactive Patient Education ©2016 Elsevier Inc. ° °Nausea, Adult °Nausea is the feeling that you have an upset stomach or have to vomit. Nausea by itself is not likely a serious concern, but it may be an early sign of more serious medical problems. As nausea gets worse, it can lead to vomiting. If vomiting develops, there is the risk of dehydration.  °CAUSES  °· Viral infections. °· Food poisoning. °· Medicines. °· Pregnancy. °· Motion sickness. °· Migraine headaches. °· Emotional distress. °· Severe pain from any source. °· Alcohol intoxication. °HOME CARE INSTRUCTIONS °· Get plenty of rest. °· Ask your caregiver about specific rehydration instructions. °· Eat small amounts of food and sip liquids more often. °· Take all medicines as told by your caregiver. °SEEK MEDICAL CARE IF: °· You have not improved after 2 days, or you get worse. °· You have a headache. °SEEK IMMEDIATE MEDICAL CARE IF:  °· You have a  fever. °· You faint. °· You keep vomiting or have blood in your vomit. °· You are extremely weak or dehydrated. °· You have dark or bloody stools. °· You have severe chest or abdominal pain. °MAKE SURE YOU: °· Understand these instructions. °· Will watch your condition. °· Will get help right away if you are not doing well or get worse. °  °This information is not intended to replace advice given to you by your health care provider. Make sure you discuss any questions you have with your health care provider. °  °Document Released: 10/02/2004 Document Revised: 09/15/2014 Document Reviewed: 05/07/2011 °Elsevier Interactive Patient Education ©2016 Elsevier Inc. ° °

## 2015-11-26 NOTE — ED Notes (Signed)
Patient c/o cold sx, fatigue and fever like chills x 2 days. Never took his temperature. He reports he vomited twice today. Patient is in NAD.

## 2015-11-27 ENCOUNTER — Emergency Department (HOSPITAL_COMMUNITY)
Admission: EM | Admit: 2015-11-27 | Discharge: 2015-11-27 | Disposition: A | Discharge status: Home / Self Care | Payer: Managed Care, Other (non HMO) | Attending: Emergency Medicine | Admitting: Emergency Medicine

## 2015-11-27 ENCOUNTER — Emergency Department (HOSPITAL_COMMUNITY): Payer: Managed Care, Other (non HMO)

## 2015-11-27 ENCOUNTER — Encounter (HOSPITAL_COMMUNITY): Payer: Self-pay | Admitting: Emergency Medicine

## 2015-11-27 DIAGNOSIS — J45909 Unspecified asthma, uncomplicated: Secondary | ICD-10-CM | POA: Diagnosis not present

## 2015-11-27 DIAGNOSIS — Z87891 Personal history of nicotine dependence: Secondary | ICD-10-CM | POA: Diagnosis not present

## 2015-11-27 DIAGNOSIS — J0110 Acute frontal sinusitis, unspecified: Secondary | ICD-10-CM | POA: Insufficient documentation

## 2015-11-27 DIAGNOSIS — K219 Gastro-esophageal reflux disease without esophagitis: Secondary | ICD-10-CM | POA: Diagnosis not present

## 2015-11-27 DIAGNOSIS — R05 Cough: Secondary | ICD-10-CM | POA: Diagnosis present

## 2015-11-27 LAB — COMPREHENSIVE METABOLIC PANEL
ALBUMIN: 3.9 g/dL (ref 3.5–5.0)
ALT: 25 U/L (ref 17–63)
AST: 27 U/L (ref 15–41)
Alkaline Phosphatase: 76 U/L (ref 38–126)
Anion gap: 13 (ref 5–15)
BUN: 11 mg/dL (ref 6–20)
CHLORIDE: 104 mmol/L (ref 101–111)
CO2: 21 mmol/L — AB (ref 22–32)
CREATININE: 1.31 mg/dL — AB (ref 0.61–1.24)
Calcium: 8.9 mg/dL (ref 8.9–10.3)
GFR calc non Af Amer: >60 mL/min (ref >60)
Glucose, Bld: 86 mg/dL (ref 65–99)
Potassium: 4.7 mmol/L (ref 3.5–5.1)
SODIUM: 138 mmol/L (ref 135–145)
Total Bilirubin: 0.7 mg/dL (ref 0.3–1.2)
Total Protein: 7.2 g/dL (ref 6.5–8.1)

## 2015-11-27 LAB — CBC WITH DIFFERENTIAL/PLATELET
BASOS ABS: 0 10*3/uL (ref 0.0–0.1)
BASOS PCT: 0 %
EOS ABS: 0 10*3/uL (ref 0.0–0.7)
Eosinophils Relative: 0 %
HCT: 44.6 % (ref 39.0–52.0)
Hemoglobin: 14.7 g/dL (ref 13.0–17.0)
LYMPHS PCT: 25 %
Lymphs Abs: 1.1 10*3/uL (ref 0.7–4.0)
MCH: 27.9 pg (ref 26.0–34.0)
MCHC: 33 g/dL (ref 30.0–36.0)
MCV: 84.8 fL (ref 78.0–100.0)
MONOS PCT: 18 %
Monocytes Absolute: 0.8 10*3/uL (ref 0.1–1.0)
NEUTROS PCT: 57 %
Neutro Abs: 2.6 10*3/uL (ref 1.7–7.7)
Platelets: 128 10*3/uL — ABNORMAL LOW (ref 150–400)
RBC: 5.26 MIL/uL (ref 4.22–5.81)
RDW: 12.6 % (ref 11.5–15.5)
WBC: 4.5 10*3/uL (ref 4.0–10.5)

## 2015-11-27 LAB — LIPASE, BLOOD: Lipase: 21 U/L (ref 11–51)

## 2015-11-27 MED ORDER — OXYMETAZOLINE HCL 0.05 % NA SOLN
2.0000 | Freq: Two times a day (BID) | NASAL | Status: AC
Start: 2015-11-27 — End: ?

## 2015-11-27 MED ORDER — LORATADINE 10 MG PO TABS: 10.0000 mg | ORAL_TABLET | Freq: Every day | ORAL | Status: DC

## 2015-11-27 MED ORDER — PANTOPRAZOLE SODIUM 20 MG PO TBEC: 20.0000 mg | DELAYED_RELEASE_TABLET | Freq: Every day | ORAL | Status: AC

## 2015-11-27 MED ORDER — PANTOPRAZOLE SODIUM 40 MG IV SOLR
40.0000 mg | Freq: Once | INTRAVENOUS | Status: DC
  Filled 2015-11-27: qty 40

## 2015-11-27 MED ORDER — AZITHROMYCIN 250 MG PO TABS: ORAL_TABLET | ORAL | Status: AC

## 2015-11-27 MED ORDER — ONDANSETRON HCL 4 MG/2ML IJ SOLN
4.0000 mg | Freq: Once | INTRAMUSCULAR | Status: AC
  Administered 2015-11-27: 4 mg via INTRAVENOUS
  Filled 2015-11-27: qty 2

## 2015-11-27 MED ORDER — MOMETASONE FUROATE 50 MCG/ACT NA SUSP
2.0000 | Freq: Every day | NASAL | Status: AC
Start: 2015-11-27 — End: ?

## 2015-11-27 MED ORDER — GI COCKTAIL ~~LOC~~
30.0000 mL | Freq: Once | ORAL | Status: AC
  Administered 2015-11-27: 30 mL via ORAL
  Filled 2015-11-27: qty 30

## 2015-11-27 MED ORDER — SODIUM CHLORIDE 0.9 % IV BOLUS (SEPSIS)
2000.0000 mL | Freq: Once | INTRAVENOUS | Status: AC
  Administered 2015-11-27: 2000 mL via INTRAVENOUS

## 2015-11-27 MED ORDER — ONDANSETRON 4 MG PO TBDP: ORAL_TABLET | ORAL | Status: AC

## 2015-11-27 NOTE — ED Provider Notes (Signed)
CSN: [id number]     Arrival date & time 11/26/15  1922 History   First MD Initiated Contact with Patient 11/26/15 2102     Chief Complaint  Patient presents with  . URI   (Consider location/radiation/quality/duration/timing/severity/associated sxs/prior Treatment) HPI  History obtained from patient: states he has not felt well in the last few days. 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 Not eating, feels nauseated at times with a "taste"in his mouth. Has been able to work. No fever, or diarrhea. Some congestion.  States that he took as "liquid medication" but did vomit later. States he spoke with pharmacist who advised him despite 2015 expiration date, medication was probably ok to take.   Past Medical History  Diagnosis Date  . Asthma    History reviewed. No pertinent past surgical history. History reviewed. No pertinent family history. Social History  Substance Use Topics  . Smoking status: Former Games developer  . Smokeless tobacco: None  . Alcohol Use: No    Review of Systems Episodes of nausea, weakness Allergies  Tylenol  Home Medications   Prior to Admission medications   Medication Sig Start Date End Date Taking? Authorizing Provider  ibuprofen (ADVIL,MOTRIN) 600 MG tablet Take 1 tablet (600 mg total) by mouth every 6 (six) hours as needed for moderate pain. 03/01/15   Marisa Severin, MD  ondansetron (ZOFRAN) 4 MG tablet Take 1 tablet (4 mg total) by mouth every 6 (six) hours. 11/26/15   Tharon Aquas, PA  oxyCODONE (ROXICODONE) 5 MG immediate release tablet Take 1 tablet (5 mg total) by mouth every 4 (four) hours as needed for severe pain. 03/01/15   Marisa Severin, MD  triamcinolone cream (KENALOG) 0.1 % Apply 1 application topically 2 (two) times daily as needed (swelling).  09/27/14   Historical Provider, MD   Meds Ordered and Administered this Visit  Medications - No data to display  BP 111/54 mmHg  Pulse 89  Temp(Src) 99.2 F  (37.3 C) (Oral)  Resp 16  SpO2 98% No data found.   Physical Exam NURSES NOTES AND VITAL SIGNS REVIEWED. CONSTITUTIONAL: Well developed, well nourished, no acute distress HEENT: normocephalic, atraumatic, right and left TM's are normal EYES: Conjunctiva normal NECK:normal ROM, supple, no adenopathy PULMONARY:No respiratory distress, normal effort, Lungs: CTAb/l, no wheezes, or increased work of breathing CARDIOVASCULAR: RRR, no murmur ABDOMEN: soft, ND, NT, +'ve BS MUSCULOSKELETAL: Normal ROM of all extremities,  SKIN: warm and dry without rash PSYCHIATRIC: Mood and affect, behavior are normal  ED Course  Procedures (including critical care time)  Labs Review Labs Reviewed - No data to display  Imaging Review No results found.   Visual Acuity Review  Right Eye Distance:   Left Eye Distance:   Bilateral Distance:    Right Eye Near:   Left Eye Near:    Bilateral Near:        Symptoms are quite vague and do not point to any acute emergent illness Suggest symptomatic treatment and follow up if getting worse or no improving.  MDM   1. Acute URI   2. Nausea     Patient is reassured that there are no issues that require transfer to higher level of care at this time or additional tests. Patient is advised to continue home symptomatic treatment. Patient is advised that if there are new or worsening symptoms to attend the emergency department, contact primary care provider, or return to UC. Instructions of  care provided discharged home in stable condition. Return to work/school note provided.   THIS NOTE WAS GENERATED USING A VOICE RECOGNITION SOFTWARE PROGRAM. ALL REASONABLE EFFORTS  WERE MADE TO PROOFREAD THIS DOCUMENT FOR ACCURACY.  I have verbally reviewed the discharge instructions with the patient. A printed AVS was given to the patient.  All questions were answered prior to discharge.      Tharon AquasFrank C Adonys Wildes, [place] 11/27/15 [phone]

## 2015-11-27 NOTE — Discharge Instructions (Signed)
Gastroesophageal Reflux Disease, Adult Normally, food travels down the esophagus and stays in the stomach to be digested. However, when a person has gastroesophageal reflux disease (GERD), food and stomach acid move back up into the esophagus. When this happens, the esophagus becomes sore and inflamed. Over time, GERD can create small holes (ulcers) in the lining of the esophagus.  CAUSES This condition is caused by a problem with the muscle between the esophagus and the stomach (lower esophageal sphincter, or LES). Normally, the LES muscle closes after food passes through the esophagus to the stomach. When the LES is weakened or abnormal, it does not close properly, and that allows food and stomach acid to go back up into the esophagus. The LES can be weakened by certain dietary substances, medicines, and medical conditions, including:  Tobacco use.  Pregnancy.  Having a hiatal hernia.  Heavy alcohol use.  Certain foods and beverages, such as coffee, chocolate, onions, and peppermint. RISK FACTORS This condition is more likely to develop in:  People who have an increased body weight.  People who have connective tissue disorders.  People who use NSAID medicines. SYMPTOMS Symptoms of this condition include:  Heartburn.  Difficult or painful swallowing.  The feeling of having a lump in the throat.  Abitter taste in the mouth.  Bad breath.  Having a large amount of saliva.  Having an upset or bloated stomach.  Belching.  Chest pain.  Shortness of breath or wheezing.  Ongoing (chronic) cough or a night-time cough.  Wearing away of tooth enamel.  Weight loss. Different conditions can cause chest pain. Make sure to see your health care provider if you experience chest pain. DIAGNOSIS Your health care provider will take a medical history and perform a physical exam. To determine if you have mild or severe GERD, your health care provider may also monitor how you respond  to treatment. You may also have other tests, including:  An endoscopy toexamine your stomach and esophagus with a small camera.  A test thatmeasures the acidity level in your esophagus.  A test thatmeasures how much pressure is on your esophagus.  A barium swallow or modified barium swallow to show the shape, size, and functioning of your esophagus. TREATMENT The goal of treatment is to help relieve your symptoms and to prevent complications. Treatment for this condition may vary depending on how severe your symptoms are. Your health care provider may recommend:  Changes to your diet.  Medicine.  Surgery. HOME CARE INSTRUCTIONS Diet  Follow a diet as recommended by your health care provider. This may involve avoiding foods and drinks such as:  Coffee and tea (with or without caffeine).  Drinks that containalcohol.  Energy drinks and sports drinks.  Carbonated drinks or sodas.  Chocolate and cocoa.  Peppermint and mint flavorings.  Garlic and onions.  Horseradish.  Spicy and acidic foods, including peppers, chili powder, curry powder, vinegar, hot sauces, and barbecue sauce.  Citrus fruit juices and citrus fruits, such as oranges, lemons, and limes.  Tomato-based foods, such as red sauce, chili, salsa, and pizza with red sauce.  Fried and fatty foods, such as donuts, french fries, potato chips, and high-fat dressings.  High-fat meats, such as hot dogs and fatty cuts of red and white meats, such as rib eye steak, sausage, ham, and bacon.  High-fat dairy items, such as whole milk, butter, and cream cheese.  Eat small, frequent meals instead of large meals.  Avoid drinking large amounts of liquid with your  meals.  Avoid eating meals during the 2-3 hours before bedtime.  Avoid lying down right after you eat.  Do not exercise right after you eat. General Instructions  Pay attention to any changes in your symptoms.  Take over-the-counter and prescription  medicines only as told by your health care provider. Do not take aspirin, ibuprofen, or other NSAIDs unless your health care provider told you to do so.  Do not use any tobacco products, including cigarettes, chewing tobacco, and e-cigarettes. If you need help quitting, ask your health care provider.  Wear loose-fitting clothing. Do not wear anything tight around your waist that causes pressure on your abdomen.  Raise (elevate) the head of your bed 6 inches (15cm).  Try to reduce your stress, such as with yoga or meditation. If you need help reducing stress, ask your health care provider.  If you are overweight, reduce your weight to an amount that is healthy for you. Ask your health care provider for guidance about a safe weight loss goal.  Keep all follow-up visits as told by your health care provider. This is important. SEEK MEDICAL CARE IF:  You have new symptoms.  You have unexplained weight loss.  You have difficulty swallowing, or it hurts to swallow.  You have wheezing or a persistent cough.  Your symptoms do not improve with treatment.  You have a hoarse voice. SEEK IMMEDIATE MEDICAL CARE IF:  You have pain in your arms, neck, jaw, teeth, or back.  You feel sweaty, dizzy, or light-headed.  You have chest pain or shortness of breath.  You vomit and your vomit looks like blood or coffee grounds.  You faint.  Your stool is bloody or black.  You cannot swallow, drink, or eat.   This information is not intended to replace advice given to you by your health care provider. Make sure you discuss any questions you have with your health care provider.   Document Released: 06/04/2005 Document Revised: 05/16/2015 Document Reviewed: 12/20/2014 Elsevier Interactive Patient Education 2016 [place] for Gastroesophageal Reflux Disease, Adult When you have gastroesophageal reflux disease (GERD), the foods you eat and your eating habits are very important.  Choosing the right foods can help ease the discomfort of GERD. WHAT GENERAL GUIDELINES DO I NEED TO FOLLOW?  Choose fruits, vegetables, whole grains, low-fat dairy products, and low-fat meat, fish, and poultry.  Limit fats such as oils, salad dressings, butter, nuts, and avocado.  Keep a food diary to identify foods that cause symptoms.  Avoid foods that cause reflux. These may be different for different people.  Eat frequent small meals instead of three large meals each day.  Eat your meals slowly, in a relaxed setting.  Limit fried foods.  Cook foods using methods other than frying.  Avoid drinking alcohol.  Avoid drinking large amounts of liquids with your meals.  Avoid bending over or lying down until 2-3 hours after eating. WHAT FOODS ARE NOT RECOMMENDED? The following are some foods and drinks that may worsen your symptoms: Vegetables Tomatoes. Tomato juice. Tomato and spaghetti sauce. Chili peppers. Onion and garlic. Horseradish. Fruits Oranges, grapefruit, and lemon (fruit and juice). Meats High-fat meats, fish, and poultry. This includes hot dogs, ribs, ham, sausage, salami, and bacon. Dairy Whole milk and chocolate milk. Sour cream. Cream. Butter. Ice cream. Cream cheese.  Beverages Coffee and tea, with or without caffeine. Carbonated beverages or energy drinks. Condiments Hot sauce. Barbecue sauce.  Sweets/Desserts Chocolate and cocoa. Donuts. Peppermint and  spearmint. Fats and Oils High-fat foods, including [place] fries and potato chips. Other Vinegar. Strong spices, such as black pepper, white pepper, red pepper, cayenne, curry powder, cloves, ginger, and chili powder. The items listed above may not be a complete list of foods and beverages to avoid. Contact your dietitian for more information.   This information is not intended to replace advice given to you by your health care provider. Make sure you discuss any questions you have with your health care  provider.   Document Released: 08/25/2005 Document Revised: 09/15/2014 Document Reviewed: 06/29/2013 Elsevier Interactive Patient Education 2016 Elsevier Inc.  Sinusitis, Adult Sinusitis is redness, soreness, and inflammation of the paranasal sinuses. Paranasal sinuses are air pockets within the bones of your face. They are located beneath your eyes, in the middle of your forehead, and above your eyes. In healthy paranasal sinuses, mucus is able to drain out, and air is able to circulate through them by way of your nose. However, when your paranasal sinuses are inflamed, mucus and air can become trapped. This can allow bacteria and other germs to grow and cause infection. Sinusitis can develop quickly and last only a short time (acute) or continue over a long period (chronic). Sinusitis that lasts for more than 12 weeks is considered chronic. CAUSES Causes of sinusitis include:  Allergies.  Structural abnormalities, such as displacement of the cartilage that separates your nostrils (deviated septum), which can decrease the air flow through your nose and sinuses and affect sinus drainage.  Functional abnormalities, such as when the small hairs (cilia) that line your sinuses and help remove mucus do not work properly or are not present. SIGNS AND SYMPTOMS Symptoms of acute and chronic sinusitis are the same. The primary symptoms are pain and pressure around the affected sinuses. Other symptoms include:  Upper toothache.  Earache.  Headache.  Bad breath.  Decreased sense of smell and taste.  A cough, which worsens when you are lying flat.  Fatigue.  Fever.  Thick drainage from your nose, which often is green and may contain pus (purulent).  Swelling and warmth over the affected sinuses. DIAGNOSIS Your health care provider will perform a physical exam. During your exam, your health care provider may perform any of the following to help determine if you have acute sinusitis or  chronic sinusitis:  Look in your nose for signs of abnormal growths in your nostrils (nasal polyps).  Tap over the affected sinus to check for signs of infection.  View the inside of your sinuses using an imaging device that has a light attached (endoscope). If your health care provider suspects that you have chronic sinusitis, one or more of the following tests may be recommended:  Allergy tests.  Nasal culture. A sample of mucus is taken from your nose, sent to a lab, and screened for bacteria.  Nasal cytology. A sample of mucus is taken from your nose and examined by your health care provider to determine if your sinusitis is related to an allergy. TREATMENT Most cases of acute sinusitis are related to a viral infection and will resolve on their own within 10 days. Sometimes, medicines are prescribed to help relieve symptoms of both acute and chronic sinusitis. These may include pain medicines, decongestants, nasal steroid sprays, or saline sprays. However, for sinusitis related to a bacterial infection, your health care provider will prescribe antibiotic medicines. These are medicines that will help kill the bacteria causing the infection. Rarely, sinusitis is caused by a fungal infection. In  these cases, your health care provider will prescribe antifungal medicine. For some cases of chronic sinusitis, surgery is needed. Generally, these are cases in which sinusitis recurs more than 3 times per year, despite other treatments. HOME CARE INSTRUCTIONS  Drink plenty of water. Water helps thin the mucus so your sinuses can drain more easily.  Use a humidifier.  Inhale steam 3-4 times a day (for example, sit in the bathroom with the shower running).  Apply a warm, moist washcloth to your face 3-4 times a day, or as directed by your health care provider.  Use saline nasal sprays to help moisten and clean your sinuses.  Take medicines only as directed by your health care provider.  If  you were prescribed either an antibiotic or antifungal medicine, finish it all even if you start to feel better. SEEK IMMEDIATE MEDICAL CARE IF:  You have increasing pain or severe headaches.  You have nausea, vomiting, or drowsiness.  You have swelling around your face.  You have vision problems.  You have a stiff neck.  You have difficulty breathing.   This information is not intended to replace advice given to you by your health care provider. Make sure you discuss any questions you have with your health care provider.   Document Released: 08/25/2005 Document Revised: 09/15/2014 Document Reviewed: 09/09/2011 Elsevier Interactive Patient Education [employer] Inc.

## 2015-11-27 NOTE — ED Notes (Signed)
Patient states his brother was coughing and had a fever last week. Patient states on Friday he started coughing , fever, body aches and sore throat.

## 2015-11-27 NOTE — ED Provider Notes (Signed)
CSN: [id number]     Arrival date & time 11/27/15  [id number] History   First MD Initiated Contact with Patient 11/27/15 [id number]     Chief Complaint  Patient presents with  . Cough  . Sore Throat     (Consider location/radiation/quality/duration/timing/severity/associated sxs/prior Treatment) HPI Patient presents with 5 days of cough productive of brown sputum, generalized fatigue, nasal congestion, sinus tenderness, subjective fevers and chills. States he had a brother with similar symptoms. Was seen at urgent care yesterday and diagnosed with URI. Patient states over the last few days as he has had a bad taste in the past back of his mouth. He complains about several episodes of vomiting especially after eating multiple oranges or drinking lemon juice. He also takes several doses of ibuprofen. He's had decreased oral intake. Past Medical History  Diagnosis Date  . Asthma    History reviewed. No pertinent past surgical history. No family history on file. Social History  Substance Use Topics  . Smoking status: Former Games developermoker  . Smokeless tobacco: None  . Alcohol Use: No    Review of Systems  Constitutional: Positive for fever, chills and fatigue.  HENT: Positive for congestion and sinus pressure.   Eyes: Negative for visual disturbance.  Respiratory: Positive for cough. Negative for shortness of breath.   Cardiovascular: Negative for chest pain.  Gastrointestinal: Positive for nausea, vomiting and abdominal pain. Negative for diarrhea, constipation and blood in stool.  Musculoskeletal: Negative for myalgias, back pain, neck pain and neck stiffness.  Skin: Negative for rash and wound.  Neurological: Positive for headaches. Negative for dizziness, weakness, light-headedness and numbness.  All other systems reviewed and are negative.     Allergies  Tylenol  Home Medications   Prior to Admission medications   Medication Sig Start Date End Date Taking? Authorizing Provider   azithromycin (ZITHROMAX Z-PAK) 250 MG tablet 2 po day one, then 1 daily x 4 days 11/27/15   Loren Raceravid Tvisha Schwoerer, MD  loratadine (CLARITIN) 10 MG tablet Take 1 tablet (10 mg total) by mouth daily. 11/27/15   Loren Raceravid Addis Tuohy, MD  mometasone (NASONEX) 50 MCG/ACT nasal spray Place 2 sprays into the nose daily. 11/27/15   Loren Raceravid Emmaleigh Longo, MD  ondansetron (ZOFRAN ODT) 4 MG disintegrating tablet 4mg  ODT q4 hours prn nausea/vomit 11/27/15   Loren Raceravid Mamoru Takeshita, MD  oxyCODONE (ROXICODONE) 5 MG immediate release tablet Take 1 tablet (5 mg total) by mouth every 4 (four) hours as needed for severe pain. 03/01/15   Marisa Severinlga Otter, MD  oxymetazoline (AFRIN NASAL SPRAY) 0.05 % nasal spray Place 2 sprays into both nostrils 2 (two) times daily. Use no more than 3 days 11/27/15   Loren Raceravid Syra Sirmons, MD  pantoprazole (PROTONIX) 20 MG tablet Take 1 tablet (20 mg total) by mouth daily. 11/27/15   Loren Raceravid Jordi Lacko, MD  triamcinolone cream (KENALOG) 0.1 % Apply 1 application topically 2 (two) times daily as needed (swelling).  09/27/14   Historical Provider, MD   BP 121/82 mmHg  Pulse 80  Temp(Src) 98.8 F (37.1 C) (Oral)  SpO2 96% Physical Exam  Constitutional: He is oriented to person, place, and time. He appears well-developed and well-nourished. No distress.  HENT:  Head: Normocephalic and atraumatic.  Mouth/Throat: Oropharynx is clear and moist.  Bilateral nasal mucosal edema. Right frontal and right maxillary sinus tenderness to percussion. Oropharynx is mildly erythematous but uvula is midline and tonsils have no exudates.  Eyes: EOM are normal. Pupils are equal, round, and reactive to light.  Neck: Normal range  of motion. Neck supple.  No meningismus. Mild bilateral anterior cervical lymphadenopathy.  Cardiovascular: Normal rate and regular rhythm.  Exam reveals no gallop and no friction rub.   No murmur heard. Pulmonary/Chest: Effort normal and breath sounds normal. No respiratory distress. He has no wheezes. He has no rales.  He exhibits no tenderness.  Abdominal: Soft. Bowel sounds are normal. He exhibits no distension and no mass. There is tenderness (epigastric tenderness with palpation.). There is no rebound and no guarding.  Musculoskeletal: Normal range of motion. He exhibits no edema or tenderness.  No midline thoracic or lumbar tenderness. No bilateral CVA tenderness. No lower extremity asymmetry, tenderness or swelling.  Neurological: He is alert and oriented to person, place, and time.  Skin: Skin is warm and dry. No rash noted. No erythema.  Psychiatric: He has a normal mood and affect. His behavior is normal.  Nursing note and vitals reviewed.   ED Course  Procedures (including critical care time) Labs Review Labs Reviewed  CBC WITH DIFFERENTIAL/PLATELET - Abnormal; Notable for the following:    Platelets 128 (*)    All other components within normal limits  COMPREHENSIVE METABOLIC PANEL - Abnormal; Notable for the following:    CO2 21 (*)    Creatinine, Ser 1.31 (*)    All other components within normal limits  LIPASE, BLOOD    Imaging Review Dg Chest 2 View  11/27/2015  CLINICAL DATA:  Flu-like symptoms, cough for 2 days EXAM: CHEST  2 VIEW COMPARISON:  None. FINDINGS: Cardiomediastinal silhouette is unremarkable. No infiltrate or pleural effusion. No pulmonary edema. Bony thorax is unremarkable. IMPRESSION: No active cardiopulmonary disease. Electronically Signed   By: Natasha Mead M.D.   On: 11/27/2015 09:52   I have personally reviewed and evaluated these images and lab results as part of my medical decision-making.   EKG Interpretation None      MDM   Final diagnoses:  Acute frontal sinusitis, recurrence not specified  Gastroesophageal reflux disease, esophagitis presence not specified    Patient refusing multiple medications. Had episode of posttussive emesis. Well appearing. Vital signs are stable. Suspect sinusitis and some degree of GERD/gastritis. Patient refused PPI in the  emergency department. Stated he felt better after GI cocktail. Will start on PPI and have her follow-up with gastroenterologist should his symptoms continue. He's been advised to avoid acidic foods and NSAIDs. Return precautions given.    Loren Racer, MD 11/27/15 [id number]

## 2016-03-14 ENCOUNTER — Emergency Department (HOSPITAL_COMMUNITY)
Admission: EM | Admit: 2016-03-14 | Discharge: 2016-03-14 | Disposition: A | Discharge status: Home / Self Care | Payer: Managed Care, Other (non HMO) | Attending: Emergency Medicine | Admitting: Emergency Medicine

## 2016-03-14 ENCOUNTER — Encounter (HOSPITAL_COMMUNITY): Payer: Self-pay | Admitting: Emergency Medicine

## 2016-03-14 DIAGNOSIS — Y939 Activity, unspecified: Secondary | ICD-10-CM | POA: Diagnosis not present

## 2016-03-14 DIAGNOSIS — Z79899 Other long term (current) drug therapy: Secondary | ICD-10-CM | POA: Diagnosis not present

## 2016-03-14 DIAGNOSIS — S199XXA Unspecified injury of neck, initial encounter: Secondary | ICD-10-CM | POA: Diagnosis present

## 2016-03-14 DIAGNOSIS — S161XXA Strain of muscle, fascia and tendon at neck level, initial encounter: Secondary | ICD-10-CM | POA: Insufficient documentation

## 2016-03-14 DIAGNOSIS — Y999 Unspecified external cause status: Secondary | ICD-10-CM | POA: Insufficient documentation

## 2016-03-14 DIAGNOSIS — J45909 Unspecified asthma, uncomplicated: Secondary | ICD-10-CM | POA: Insufficient documentation

## 2016-03-14 DIAGNOSIS — Y92410 Unspecified street and highway as the place of occurrence of the external cause: Secondary | ICD-10-CM | POA: Insufficient documentation

## 2016-03-14 DIAGNOSIS — Z87891 Personal history of nicotine dependence: Secondary | ICD-10-CM | POA: Diagnosis not present

## 2016-03-14 MED ORDER — IBUPROFEN 400 MG PO TABS
600.0000 mg | ORAL_TABLET | Freq: Once | ORAL | Status: AC
  Administered 2016-03-14: 600 mg via ORAL
  Filled 2016-03-14: qty 1

## 2016-03-14 MED ORDER — IBUPROFEN 600 MG PO TABS: 600.0000 mg | ORAL_TABLET | Freq: Four times a day (QID) | ORAL | Status: DC | PRN
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
## 2016-03-14 NOTE — Discharge Instructions (Signed)
Cervical Sprain  A cervical sprain is an injury in the neck in which the strong, fibrous tissues (ligaments) that connect your neck bones stretch or tear. Cervical sprains can range from mild to severe. Severe cervical sprains can cause the neck vertebrae to be unstable. This can lead to damage of the spinal cord and can result in serious nervous system problems. The amount of time it takes for a cervical sprain to get better depends on the cause and extent of the injury. Most cervical sprains heal in 1 to 3 weeks.  CAUSES   Severe cervical sprains may be caused by:    Contact sport injuries (such as from football, rugby, wrestling, hockey, auto racing, gymnastics, diving, martial arts, or boxing).    Motor vehicle collisions.    Whiplash injuries. This is an injury from a sudden forward and backward whipping movement of the head and neck.   Falls.   Mild cervical sprains may be caused by:    Being in an awkward position, such as while cradling a telephone between your ear and shoulder.    Sitting in a chair that does not offer proper support.    Working at a poorly designed computer station.    Looking up or down for long periods of time.   SYMPTOMS    Pain, soreness, stiffness, or a burning sensation in the front, back, or sides of the neck. This discomfort may develop immediately after the injury or slowly, 24 hours or more after the injury.    Pain or tenderness directly in the middle of the back of the neck.    Shoulder or upper back pain.    Limited ability to move the neck.    Headache.    Dizziness.    Weakness, numbness, or tingling in the hands or arms.    Muscle spasms.    Difficulty swallowing or chewing.    Tenderness and swelling of the neck.   DIAGNOSIS   Most of the time your health care provider can diagnose a cervical sprain by taking your history and doing a physical exam. Your health care provider will ask about previous neck injuries and any known neck  problems, such as arthritis in the neck. X-rays may be taken to find out if there are any other problems, such as with the bones of the neck. Other tests, such as a CT scan or MRI, may also be needed.   TREATMENT   Treatment depends on the severity of the cervical sprain. Mild sprains can be treated with rest, keeping the neck in place (immobilization), and pain medicines. Severe cervical sprains are immediately immobilized. Further treatment is done to help with pain, muscle spasms, and other symptoms and may include:   Medicines, such as pain relievers, numbing medicines, or muscle relaxants.    Physical therapy. This may involve stretching exercises, strengthening exercises, and posture training. Exercises and improved posture can help stabilize the neck, strengthen muscles, and help stop symptoms from returning.   HOME CARE INSTRUCTIONS    Put ice on the injured area.     Put ice in a plastic bag.     Place a towel between your skin and the bag.     Leave the ice on for 15-20 minutes, 3-4 times a day.    If your injury was severe, you may have been given a cervical collar to wear. A cervical collar is a two-piece collar designed to keep your neck from moving while it heals.      Do not remove the collar unless instructed by your health care provider.    If you have long hair, keep it outside of the collar.    Ask your health care provider before making any adjustments to your collar. Minor adjustments may be required over time to improve comfort and reduce pressure on your chin or on the back of your head.    Ifyou are allowed to remove the collar for cleaning or bathing, follow your health care provider's instructions on how to do so safely.    Keep your collar clean by wiping it with mild soap and water and drying it completely. If the collar you have been given includes removable pads, remove them every 1-2 days and hand wash them with soap and water. Allow them to air dry. They should be completely  dry before you wear them in the collar.    If you are allowed to remove the collar for cleaning and bathing, wash and dry the skin of your neck. Check your skin for irritation or sores. If you see any, tell your health care provider.    Do not drive while wearing the collar.    Only take over-the-counter or prescription medicines for pain, discomfort, or fever as directed by your health care provider.    Keep all follow-up appointments as directed by your health care provider.    Keep all physical therapy appointments as directed by your health care provider.    Make any needed adjustments to your workstation to promote good posture.    Avoid positions and activities that make your symptoms worse.    Warm up and stretch before being active to help prevent problems.   SEEK MEDICAL CARE IF:    Your pain is not controlled with medicine.    You are unable to decrease your pain medicine over time as planned.    Your activity level is not improving as expected.   SEEK IMMEDIATE MEDICAL CARE IF:    You develop any bleeding.   You develop stomach upset.   You have signs of an allergic reaction to your medicine.    Your symptoms get worse.    You develop new, unexplained symptoms.    You have numbness, tingling, weakness, or paralysis in any part of your body.   MAKE SURE YOU:    Understand these instructions.   Will watch your condition.   Will get help right away if you are not doing well or get worse.     This information is not intended to replace advice given to you by your health care provider. Make sure you discuss any questions you have with your health care provider.     Document Released: 06/22/2007 Document Revised: 08/30/2013 Document Reviewed: 03/02/2013  [employer]vier Interactive Patient Education 2016 [employer]

## 2016-09-06 ENCOUNTER — Emergency Department (HOSPITAL_COMMUNITY)
Admission: EM | Admit: 2016-09-06 | Discharge: 2016-09-06 | Disposition: A | Discharge status: Home / Self Care | Payer: Managed Care, Other (non HMO) | Attending: Emergency Medicine | Admitting: Emergency Medicine

## 2016-09-06 ENCOUNTER — Encounter (HOSPITAL_COMMUNITY): Payer: Self-pay | Admitting: Emergency Medicine

## 2016-09-06 DIAGNOSIS — Y929 Unspecified place or not applicable: Secondary | ICD-10-CM | POA: Insufficient documentation

## 2016-09-06 DIAGNOSIS — M549 Dorsalgia, unspecified: Secondary | ICD-10-CM

## 2016-09-06 DIAGNOSIS — Y999 Unspecified external cause status: Secondary | ICD-10-CM | POA: Insufficient documentation

## 2016-09-06 DIAGNOSIS — Y939 Activity, unspecified: Secondary | ICD-10-CM | POA: Insufficient documentation

## 2016-09-06 DIAGNOSIS — Z87891 Personal history of nicotine dependence: Secondary | ICD-10-CM | POA: Diagnosis not present

## 2016-09-06 DIAGNOSIS — M546 Pain in thoracic spine: Secondary | ICD-10-CM | POA: Diagnosis not present

## 2016-09-06 DIAGNOSIS — J45909 Unspecified asthma, uncomplicated: Secondary | ICD-10-CM | POA: Diagnosis not present

## 2016-09-06 DIAGNOSIS — X501XXA Overexertion from prolonged static or awkward postures, initial encounter: Secondary | ICD-10-CM | POA: Diagnosis not present

## 2016-09-06 MED ORDER — METHOCARBAMOL 500 MG PO TABS
500.0000 mg | ORAL_TABLET | Freq: Once | ORAL | Status: AC
  Administered 2016-09-06: 500 mg via ORAL
  Filled 2016-09-06: qty 1

## 2016-09-06 MED ORDER — METHOCARBAMOL 500 MG PO TABS: 500.0000 mg | ORAL_TABLET | Freq: Two times a day (BID) | ORAL | 0 refills | Status: AC

## 2016-09-06 NOTE — ED Provider Notes (Signed)
[facility]-EMERGENCY DEPT Provider Note   CSN: [id number] Arrival date & time: 09/06/16  1105  By signing my name below, I, Majel HomerPeyton Lee, attest that this documentation has been prepared under the direction and in the presence of Sharilyn SitesLisa Zachry Hopfensperger, PA-C.  Electronically Signed: Majel HomerPeyton Lee, ED Scribe. 09/06/16. 11:54 AM.  History   Chief Complaint Chief Complaint  Patient presents with  . Back Pain   The history is provided by the patient. No language interpreter was used.   HPI Comments: Charles LuxHassan Padilla is a 30 y.o. male with PMHx of asthma, who presents to the Emergency Department complaining of sudden onset, 8/10, upper back pain that began last night. Pt reports his pain is exacerbated with movement and twisting of his upper back. He states he drove for a "long period of time" yesterday before sitting down in a chair for ~1 hour at home before the onset of his pain. He reports he has applied a heating pad to his back but states he has not tried any medication to relieve his pain.  He notes he lifts 60-70 pounds often at work but denies any recent injury to his back.  No falls or trauma.  No pain into the arms or legs.  No numbness/weakness.  No difficulty walking.  No medications tried PTA.  Past Medical History:  Diagnosis Date  . Asthma    There are no active problems to display for this patient.  History reviewed. No pertinent surgical history.  Home Medications    Prior to Admission medications   Medication Sig Start Date End Date Taking? Authorizing Provider  azithromycin (ZITHROMAX Z-PAK) 250 MG tablet 2 po day one, then 1 daily x 4 days 11/27/15   Loren Raceravid Yelverton, MD  ibuprofen (ADVIL,MOTRIN) 600 MG tablet Take 1 tablet (600 mg total) by mouth every 6 (six) hours as needed. 03/14/16   Shon Batonourtney F Horton, MD  loratadine (CLARITIN) 10 MG tablet Take 1 tablet (10 mg total) by mouth daily. 11/27/15   Loren Raceravid Yelverton, MD  mometasone (NASONEX) 50 MCG/ACT nasal spray Place 2 sprays into the nose  daily. 11/27/15   Loren Raceravid Yelverton, MD  ondansetron (ZOFRAN ODT) 4 MG disintegrating tablet 4mg  ODT q4 hours prn nausea/vomit 11/27/15   Loren Raceravid Yelverton, MD  oxyCODONE (ROXICODONE) 5 MG immediate release tablet Take 1 tablet (5 mg total) by mouth every 4 (four) hours as needed for severe pain. 03/01/15   Marisa Severinlga Otter, MD  oxymetazoline (AFRIN NASAL SPRAY) 0.05 % nasal spray Place 2 sprays into both nostrils 2 (two) times daily. Use no more than 3 days 11/27/15   Loren Raceravid Yelverton, MD  pantoprazole (PROTONIX) 20 MG tablet Take 1 tablet (20 mg total) by mouth daily. 11/27/15   Loren Raceravid Yelverton, MD  triamcinolone cream (KENALOG) 0.1 % Apply 1 application topically 2 (two) times daily as needed (swelling).  09/27/14   Historical Provider, MD    Family History History reviewed. No pertinent family history.  Social History Social History  Substance Use Topics  . Smoking status: Former Games developermoker  . Smokeless tobacco: Never Used  . Alcohol use No   Allergies   Tylenol [acetaminophen]  Review of Systems Review of Systems  Constitutional: Negative for fever.  Musculoskeletal: Positive for back pain.  All other systems reviewed and are negative.  Physical Exam Updated Vital Signs BP 114/74 (BP Location: Left Arm)   Pulse 66   Temp 98 F (36.7 C) (Oral)   Resp 20   Ht 5\' 11"  (1.803 m)  Wt 137 lb (62.1 kg)   SpO2 98%   BMI 19.11 kg/m   Physical Exam  Constitutional: He is oriented to person, place, and time. He appears well-developed and well-nourished.  HENT:  Head: Normocephalic and atraumatic.  Mouth/Throat: Oropharynx is clear and moist.  Eyes: Conjunctivae and EOM are normal. Pupils are equal, round, and reactive to light.  Neck: Normal range of motion.  Cardiovascular: Normal rate, regular rhythm and normal heart sounds.   Pulmonary/Chest: Effort normal and breath sounds normal.  Abdominal: Soft. Bowel sounds are normal.  Musculoskeletal: Normal range of motion.        Back:  Tenderness of thoracic paraspinal muscles bilaterally; no midline step-off or deformity; full ROM maintained of all spinal levels; normal strength and sensation of both arms and legs; normal gait unassisted  Neurological: He is alert and oriented to person, place, and time.  Skin: Skin is warm and dry.  Psychiatric: He has a normal mood and affect.  Nursing note and vitals reviewed.  ED Treatments / Results  Labs (all labs ordered are listed, but only abnormal results are displayed) Labs Reviewed - No data to display  EKG  EKG Interpretation None       Radiology No results found.  Procedures Procedures (including critical care time)  Medications Ordered in ED Medications - No data to display  DIAGNOSTIC STUDIES:  Oxygen Saturation is 98% on RA, normal by my interpretation.    COORDINATION OF CARE:  11:51 AM Discussed treatment plan with pt at bedside and pt agreed to plan.  Initial Impression / Assessment and Plan / ED Course  I have reviewed the triage vital signs and the nursing notes.  Pertinent labs & imaging results that were available during my care of the patient were reviewed by me and considered in my medical decision making (see chart for details).  Clinical Course    30 year old male here with back pain.  Does report frequent heavy lifting of 60-70 pound boxes as well as a long drive in the car yesterday. On exam he has tenderness of his thoracic paraspinal muscles bilaterally. There is no midline step-off or deformity. He is neurologically intact without noted focal deficit. He is ambulatory with a steady gait. Suspect muscle strain. Will treat symptomatically.  Recommended heat therapy as well.  Discussed plan with patient, he acknowledged understanding and agreed with plan of care.  Return precautions given for new or worsening symptoms.  Final Clinical Impressions(s) / ED Diagnoses   Final diagnoses:  Acute bilateral back pain, unspecified back  location    New Prescriptions New Prescriptions   METHOCARBAMOL (ROBAXIN) 500 MG TABLET    Take 1 tablet (500 mg total) by mouth 2 (two) times daily.   I personally performed the services described in this documentation, which was scribed in my presence. The recorded information has been reviewed and is accurate.   Garlon HatchetLisa M Chadley Dziedzic, PA-C 09/06/16 1206    Tilden FossaElizabeth Rees, MD 09/09/16 1910

## 2016-09-06 NOTE — Discharge Instructions (Signed)
Take the prescribed medication as directed.  Recommend to take motrin with this for added relief. May wish to use heating pad on the back as well to help relax muscles. Follow-up with your primary care doctor. Return to the ED for new or worsening symptoms.

## 2016-09-06 NOTE — ED Triage Notes (Signed)
Patient reports upper back pain since last night. States pain increases with movement. Denies injury. Ambulatory to triage.

## 2017-02-16 ENCOUNTER — Encounter: Payer: Self-pay | Admitting: Physician Assistant

## 2017-02-16 ENCOUNTER — Ambulatory Visit (INDEPENDENT_AMBULATORY_CARE_PROVIDER_SITE_OTHER): Payer: BLUE CROSS/BLUE SHIELD | Admitting: Physician Assistant

## 2017-02-16 VITALS — BP 116/75 | HR 67 | Temp 98.5°F | Resp 18 | Ht 68.11 in | Wt 131.6 lb

## 2017-02-16 DIAGNOSIS — J069 Acute upper respiratory infection, unspecified: Secondary | ICD-10-CM | POA: Diagnosis not present

## 2017-02-16 MED ORDER — LORATADINE 10 MG PO TABS: 10.0000 mg | ORAL_TABLET | Freq: Every day | ORAL | 0 refills | Status: AC

## 2017-02-16 MED ORDER — HYDROCOD POLST-CPM POLST ER 10-8 MG/5ML PO SUER: 5.0000 mL | Freq: Every evening | ORAL | 0 refills | Status: AC | PRN

## 2017-02-16 MED ORDER — IBUPROFEN 600 MG PO TABS: 600.0000 mg | ORAL_TABLET | Freq: Three times a day (TID) | ORAL | 0 refills | Status: AC | PRN

## 2017-02-16 NOTE — Patient Instructions (Addendum)
We will treat this supportively at this time.  I would like you to try to hydrate well when you are able to take it. I would like you to take medication as prescribed.   Upper Respiratory Infection, Adult Most upper respiratory infections (URIs) are caused by a virus. A URI affects the nose, throat, and upper air passages. The most common type of URI is often called "the common cold." Follow these instructions at home:  Take medicines only as told by your doctor.  Gargle warm saltwater or take cough drops to comfort your throat as told by your doctor.  Use a warm mist humidifier or inhale steam from a shower to increase air moisture. This may make it easier to breathe.  Drink enough fluid to keep your pee (urine) clear or pale yellow.  Eat soups and other clear broths.  Have a healthy diet.  Rest as needed.  Go back to work when your fever is gone or your doctor says it is okay. ? You may need to stay home longer to avoid giving your URI to others. ? You can also wear a face mask and wash your hands often to prevent spread of the virus.  Use your inhaler more if you have asthma.  Do not use any tobacco products, including cigarettes, chewing tobacco, or electronic cigarettes. If you need help quitting, ask your doctor. Contact a doctor if:  You are getting worse, not better.  Your symptoms are not helped by medicine.  You have chills.  You are getting more short of breath.  You have brown or red mucus.  You have yellow or brown discharge from your nose.  You have pain in your face, especially when you bend forward.  You have a fever.  You have puffy (swollen) neck glands.  You have pain while swallowing.  You have white areas in the back of your throat. Get help right away if:  You have very bad or constant: ? Headache. ? Ear pain. ? Pain in your forehead, behind your eyes, and over your cheekbones (sinus pain). ? Chest pain.  You have long-lasting  (chronic) lung disease and any of the following: ? Wheezing. ? Long-lasting cough. ? Coughing up blood. ? A change in your usual mucus.  You have a stiff neck.  You have changes in your: ? Vision. ? Hearing. ? Thinking. ? Mood. This information is not intended to replace advice given to you by your health care provider. Make sure you discuss any questions you have with your health care provider. Document Released: 02/11/2008 Document Revised: 04/27/2016 Document Reviewed: 11/30/2013 Elsevier Interactive Patient Education  2018 [employer]     IF you received an x-ray today, you will receive an invoice from [facility]. Please contact [facility] at [phone] with questions or concerns regarding your invoice.   IF you received labwork today, you will receive an invoice from [facility]. Please contact [facility] at [phone] with questions or concerns regarding your invoice.   Our billing staff will not be able to assist you with questions regarding bills from these companies.  You will be contacted with the lab results as soon as they are available. The fastest way to get your results is to activate your My Chart account. Instructions are located on the last page of this paperwork. If you have not heard from us regarding the results in 2 weeks, please contact this office.

## 2017-02-16 NOTE — Progress Notes (Signed)
PRIMARY C[facility] AT [facility] [address], [place] [phone]  Date:  02/16/2017   Name:  Charles Padilla   DOB:  [birth year]   MRN:  [id number]  PCP:  Patient, No Pcp Per    History of Present Illness:  Charles Padilla is a 31 y.o. male patient who presents to PCP with  Chief Complaint  Patient presents with  . Sore Throat    X 4 day  . Cough    X 1 day     4 days ago, he was cleaning, 1 hour after, he developed throat itching, congestion.  He states that he had subjective fever and chills.  The next day, he took ibuprofen when he was able to eat.  He felt a little better.  Yesterday symptoms worsened.  Coughing today, productive.  No sob or dyspnea.  Green nasal mucus.  He is currently not taking any allergy medication.    There are no active problems to display for this patient.   Past Medical History:  Diagnosis Date  . Asthma     History reviewed. No pertinent surgical history.  Social History  Substance Use Topics  . Smoking status: Former Games developermoker  . Smokeless tobacco: Never Used  . Alcohol use No    Family History  Problem Relation Age of Onset  . Hypertension Mother     Allergies  Allergen Reactions  . Tylenol [Acetaminophen]     unknown    Medication list has been reviewed and updated.  Current Outpatient Prescriptions on File Prior to Visit  Medication Sig Dispense Refill  . ibuprofen (ADVIL,MOTRIN) 600 MG tablet Take 1 tablet (600 mg total) by mouth every 6 (six) hours as needed. 30 tablet 0  . azithromycin (ZITHROMAX Z-PAK) 250 MG tablet 2 po day one, then 1 daily x 4 days (Patient not taking: Reported on 02/16/2017) 5 tablet 0  . loratadine (CLARITIN) 10 MG tablet Take 1 tablet (10 mg total) by mouth daily. (Patient not taking: Reported on 02/16/2017) 30 tablet 0  . methocarbamol (ROBAXIN) 500 MG tablet Take 1 tablet (500 mg total) by mouth 2 (two) times daily. (Patient not taking: Reported on 02/16/2017) 20 tablet 0  . mometasone (NASONEX) 50  MCG/ACT nasal spray Place 2 sprays into the nose daily. (Patient not taking: Reported on 02/16/2017) 17 g 12  . ondansetron (ZOFRAN ODT) 4 MG disintegrating tablet 4mg  ODT q4 hours prn nausea/vomit (Patient not taking: Reported on 02/16/2017) 8 tablet 0  . oxyCODONE (ROXICODONE) 5 MG immediate release tablet Take 1 tablet (5 mg total) by mouth every 4 (four) hours as needed for severe pain. (Patient not taking: Reported on 02/16/2017) 20 tablet 0  . oxymetazoline (AFRIN NASAL SPRAY) 0.05 % nasal spray Place 2 sprays into both nostrils 2 (two) times daily. Use no more than 3 days (Patient not taking: Reported on 02/16/2017) 30 mL 0  . pantoprazole (PROTONIX) 20 MG tablet Take 1 tablet (20 mg total) by mouth daily. (Patient not taking: Reported on 02/16/2017) 30 tablet 0  . triamcinolone cream (KENALOG) 0.1 % Apply 1 application topically 2 (two) times daily as needed (swelling).   0  . [DISCONTINUED] ipratropium (ATROVENT) 0.06 % nasal spray Place 2 sprays into both nostrils 4 (four) times daily. (Patient not taking: Reported on 07/22/2014) 15 mL 1  . [DISCONTINUED] Simethicone (GAS-X PO) Take 1-2 tablets by mouth 2 (two) times daily as needed (gas build up).     No current facility-administered medications on file  prior to visit.     ROS ROS otherwise unremarkable unless listed above.  Physical Examination: BP 116/75 (BP Location: Right Arm, Patient Position: Sitting, Cuff Size: Small)   Pulse 67   Temp 98.5 F (36.9 C) (Oral)   Resp 18   Ht 5' 8.11" (1.73 m)   Wt 131 lb 9.6 oz (59.7 kg)   SpO2 98%   BMI 19.95 kg/m  Ideal Body Weight: Weight in (lb) to have BMI = 25: 164.6  Physical Exam  Constitutional: He is oriented to person, place, and time. He appears well-developed and well-nourished. No distress.  HENT:  Head: Normocephalic and atraumatic.  Right Ear: Tympanic membrane, external ear and ear canal normal.  Left Ear: Tympanic membrane, external ear and ear canal normal.  Nose: No  mucosal edema or rhinorrhea. Right sinus exhibits no maxillary sinus tenderness and no frontal sinus tenderness. Left sinus exhibits no maxillary sinus tenderness and no frontal sinus tenderness.  Mouth/Throat: No uvula swelling. No oropharyngeal exudate, posterior oropharyngeal edema or posterior oropharyngeal erythema.  Eyes: Conjunctivae, EOM and lids are normal. Pupils are equal, round, and reactive to light. Right eye exhibits normal extraocular motion. Left eye exhibits normal extraocular motion.  Neck: Trachea normal and full passive range of motion without pain. No edema and no erythema present.  Cardiovascular: Normal rate and regular rhythm.  Exam reveals no friction rub.   No murmur heard. Pulmonary/Chest: Effort normal. No accessory muscle usage. No apnea. No respiratory distress. He has no decreased breath sounds. He has no wheezes. He has no rhonchi.  Neurological: He is alert and oriented to person, place, and time.  Skin: Skin is warm and dry. He is not diaphoretic.  Psychiatric: He has a normal mood and affect. His behavior is normal.     Assessment and Plan: Charles Padilla is a 31 y.o. male who is here today for cc of  Chief Complaint  Patient presents with  . Sore Throat    X 4 day  . Cough    X 1 day  appears viral and sinuses untreated.   Acute upper respiratory infection - Plan: ibuprofen (ADVIL,MOTRIN) 600 MG tablet, loratadine (CLARITIN) 10 MG tablet, chlorpheniramine-HYDROcodone (TUSSIONEX PENNKINETIC ER) 10-8 MG/5ML SUER  Trena Platt, PA-C [facility] 6/14/20188:39 AM

## 2017-06-26 IMAGING — DX DG CHEST 2V
2 series · 2 of 2 positions shown · non-contrast
Comparison: None.

CLINICAL DATA: Flu-like symptoms, cough for 2 days

EXAM:
CHEST  2 VIEW

[w chest pa]
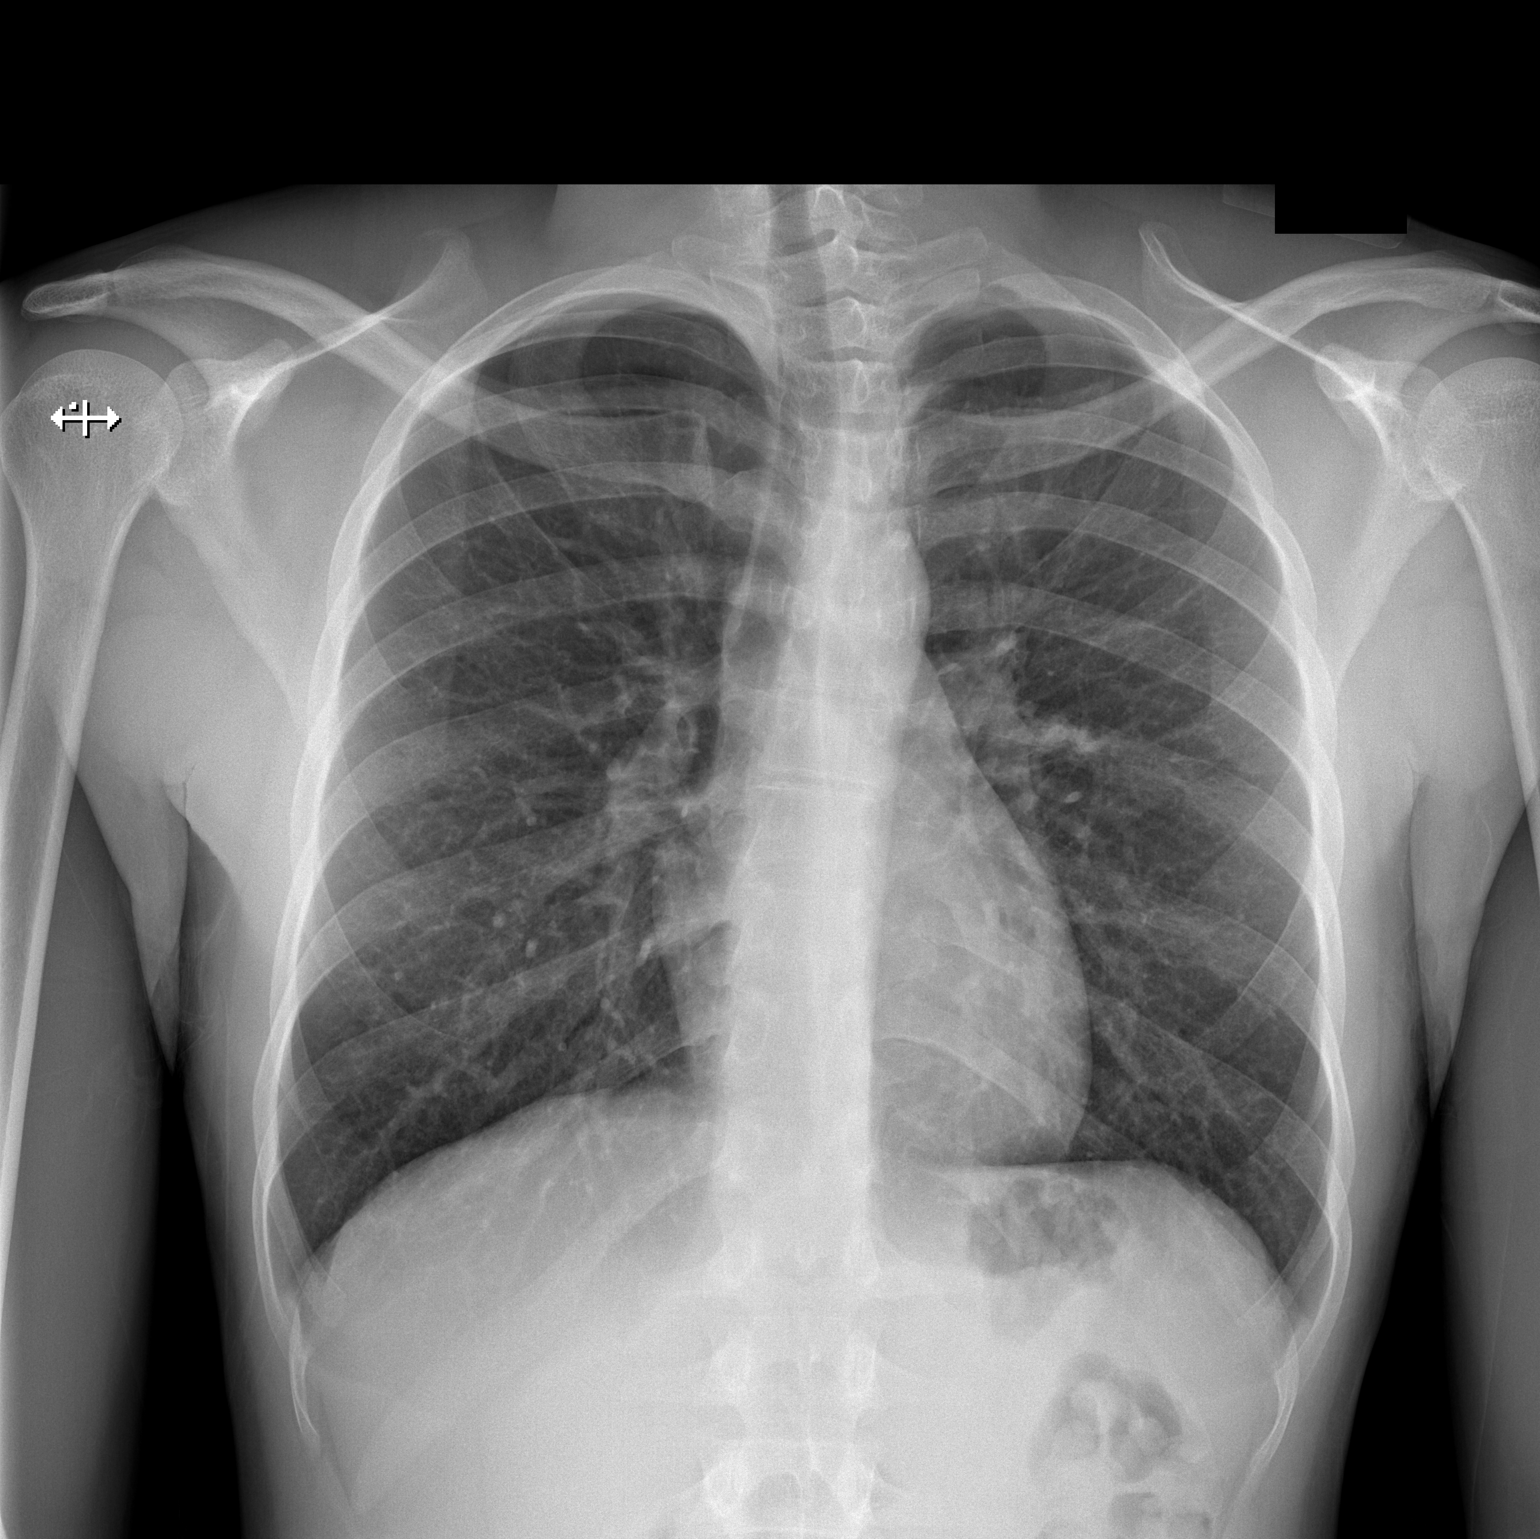

[w chest lat]
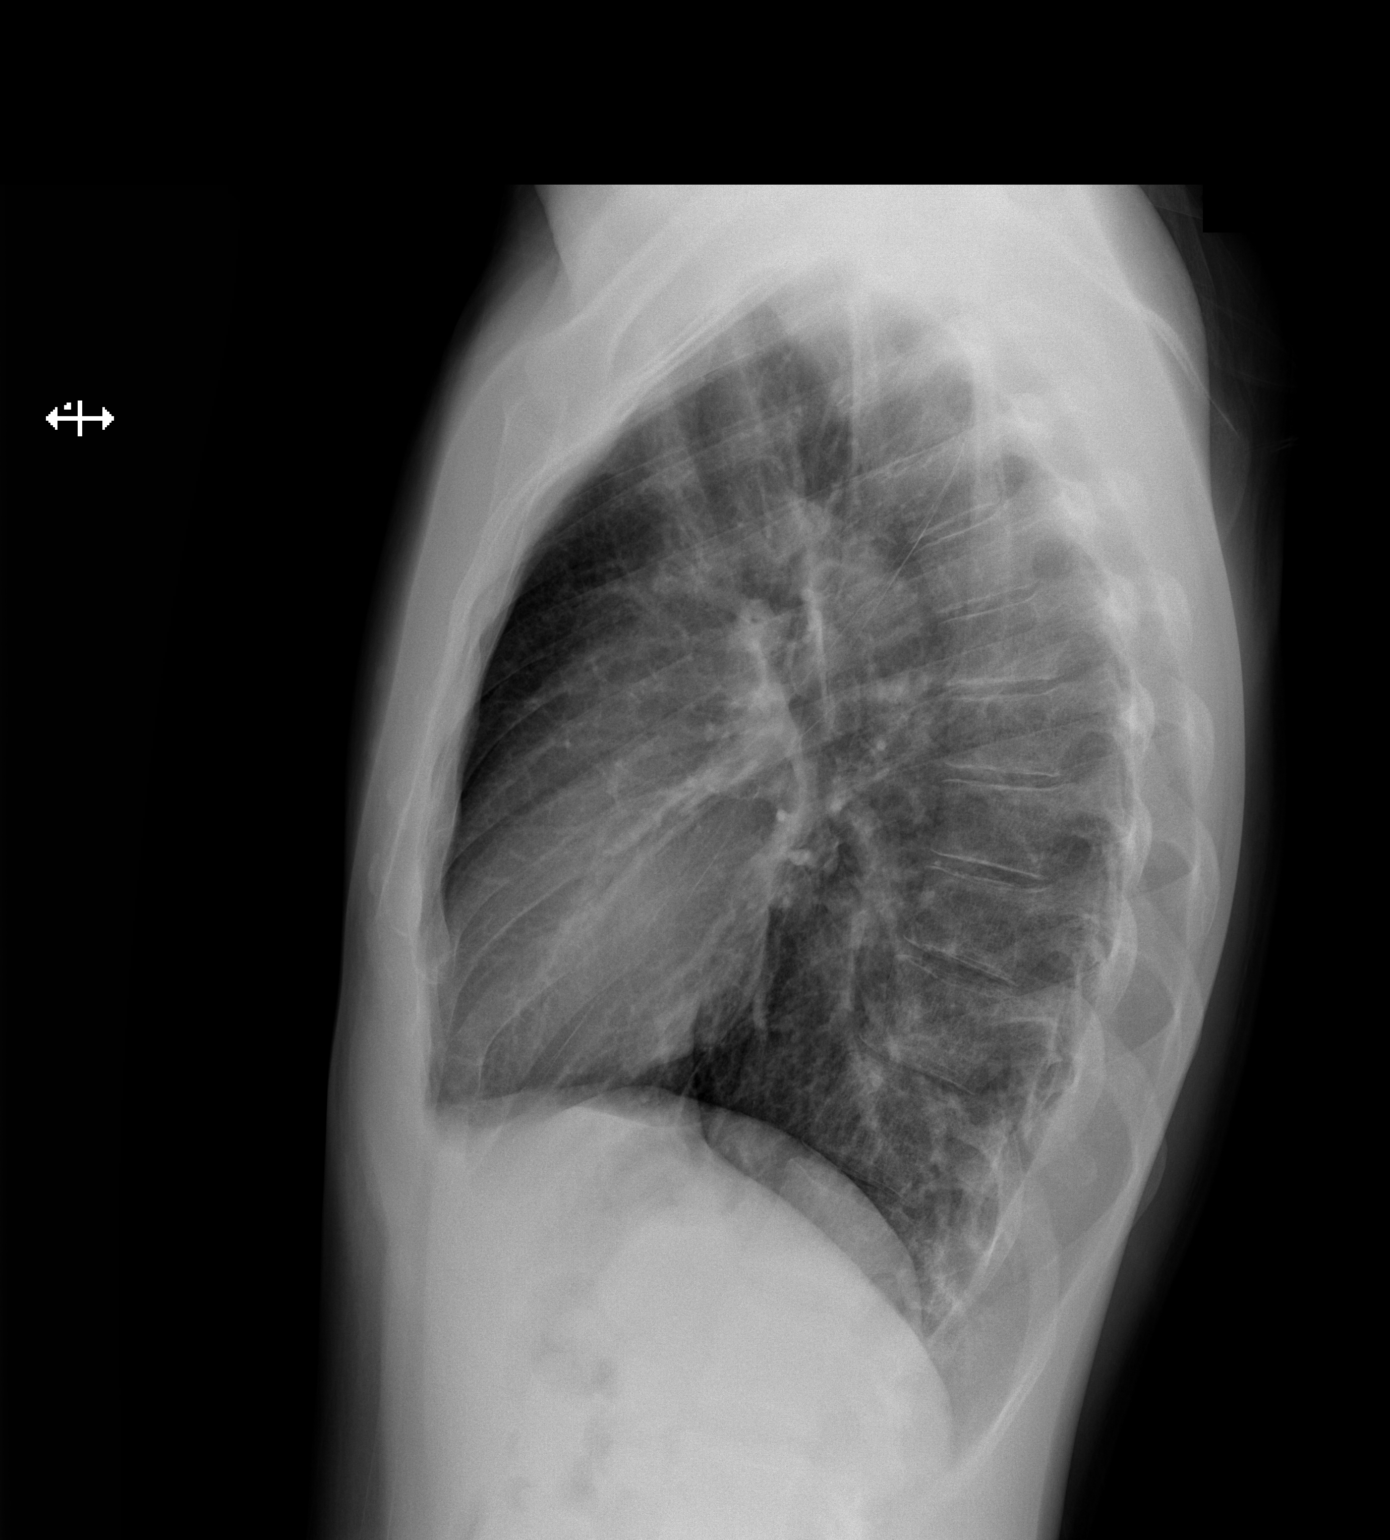

[2 of 2 positions shown; findings below may reference images not displayed]

FINDINGS: Cardiomediastinal silhouette is unremarkable. No infiltrate or
pleural effusion. No pulmonary edema. Bony thorax is unremarkable.
IMPRESSION: No active cardiopulmonary disease.

## 2017-12-07 ENCOUNTER — Encounter: Payer: Self-pay | Admitting: Physician Assistant

## 2019-07-23 ENCOUNTER — Other Ambulatory Visit: Payer: Self-pay

## 2019-07-23 ENCOUNTER — Encounter (HOSPITAL_COMMUNITY): Payer: Self-pay | Admitting: Emergency Medicine

## 2019-07-23 ENCOUNTER — Emergency Department (HOSPITAL_COMMUNITY)
Admission: EM | Admit: 2019-07-23 | Discharge: 2019-07-23 | Disposition: A | Discharge status: Home / Self Care | Payer: Managed Care, Other (non HMO) | Attending: Emergency Medicine | Admitting: Emergency Medicine

## 2019-07-23 DIAGNOSIS — M7918 Myalgia, other site: Secondary | ICD-10-CM | POA: Insufficient documentation

## 2019-07-23 DIAGNOSIS — Z87891 Personal history of nicotine dependence: Secondary | ICD-10-CM | POA: Insufficient documentation

## 2019-07-23 DIAGNOSIS — B349 Viral infection, unspecified: Secondary | ICD-10-CM

## 2019-07-23 DIAGNOSIS — U071 COVID-19: Secondary | ICD-10-CM | POA: Insufficient documentation

## 2019-07-23 DIAGNOSIS — R197 Diarrhea, unspecified: Secondary | ICD-10-CM | POA: Diagnosis not present

## 2019-07-23 DIAGNOSIS — R509 Fever, unspecified: Secondary | ICD-10-CM | POA: Diagnosis not present

## 2019-07-23 DIAGNOSIS — J45909 Unspecified asthma, uncomplicated: Secondary | ICD-10-CM | POA: Insufficient documentation

## 2019-07-23 DIAGNOSIS — R519 Headache, unspecified: Secondary | ICD-10-CM | POA: Diagnosis present

## 2019-07-23 DIAGNOSIS — R110 Nausea: Secondary | ICD-10-CM | POA: Diagnosis not present

## 2019-07-23 NOTE — ED Triage Notes (Signed)
Pt c/o recent fever, headache and emesis x several days. Pt denies exposure to Covid, denies SOB. Pt states advil helped his symptoms but headache and generalized body aches remain.

## 2019-07-23 NOTE — ED Provider Notes (Signed)
[facility]-EMERGENCY DEPT Provider Note   CSN: [id number] Arrival date & time: 07/23/19  1430     History   Chief Complaint Chief Complaint  Patient presents with  . Fever  . Headache  . Emesis    HPI Charles Padilla is a 33 y.o. male with PMHx asthma as child who presents to the ED today with multiple complaints including mild headache, subjective fever, chills, body aches, nausea, and diarrhea x 3-4 days.  She reports that earlier this week there was an individual at work that was coughing.  A couple of days later patient began having some diarrhea and a couple days after that began having slight headache.  Patient states that he felt feverish last night although did not check his temperature as he does not have a thermometer.  He states that throughout the night he had body aches although when he woke up this morning those have resolved.  He states that he no longer has a headache.  He did take some ibuprofen last night with relief.  Triage report patient complained of vomiting.  He states that he felt nauseated yesterday and had to make himself throw up by sticking his finger in his throat to gag himself.  He states that he felt improved after this. No involuntary vomiting. Denies blurry vision, double vision, neck stiffness, rash, confusion, unilateral weakness or numbness, syncope, altered mental status, abdominal pain, blood in stool, cough, shortness of breath, or any other associated symptoms.        Past Medical History:  Diagnosis Date  . Asthma     There are no active problems to display for this patient.   History reviewed. No pertinent surgical history.      Home Medications    Prior to Admission medications   Medication Sig Start Date End Date Taking? Authorizing Provider  ibuprofen (ADVIL) 200 MG tablet Take 600 mg by mouth every 6 (six) hours as needed for fever or moderate pain.   Yes [provider]  azithromycin (ZITHROMAX  Z-PAK) 250 MG tablet 2 po day one, then 1 daily x 4 days Patient not taking: Reported on 02/16/2017 11/27/15   Loren RacerYelverton, David, MD  chlorpheniramine-HYDROcodone [facility](TUSSIONEX PENNKINETIC ER) 10-8 MG/5ML SUER Take 5 mLs by mouth at bedtime as needed. Patient not taking: Reported on 07/23/2019 02/16/17   Trena PlattEnglish, Stephanie D, PA  ibuprofen (ADVIL,MOTRIN) 600 MG tablet Take 1 tablet (600 mg total) by mouth every 8 (eight) hours as needed. Patient not taking: Reported on 07/23/2019 02/16/17   Trena PlattEnglish, Stephanie D, PA  loratadine (CLARITIN) 10 MG tablet Take 1 tablet (10 mg total) by mouth daily. Patient not taking: Reported on 07/23/2019 02/16/17   Trena PlattEnglish, Stephanie D, PA  methocarbamol (ROBAXIN) 500 MG tablet Take 1 tablet (500 mg total) by mouth 2 (two) times daily. Patient not taking: Reported on 02/16/2017 09/06/16   Garlon HatchetSanders, Lisa M, PA-C  mometasone (NASONEX) 50 MCG/ACT nasal spray Place 2 sprays into the nose daily. Patient not taking: Reported on 02/16/2017 11/27/15   Loren RacerYelverton, David, MD  ondansetron (ZOFRAN ODT) 4 MG disintegrating tablet 4mg  ODT q4 hours prn nausea/vomit Patient not taking: Reported on 02/16/2017 11/27/15   Loren RacerYelverton, David, MD  oxyCODONE (ROXICODONE) 5 MG immediate release tablet Take 1 tablet (5 mg total) by mouth every 4 (four) hours as needed for severe pain. Patient not taking: Reported on 02/16/2017 03/01/15   Marisa Severintter, Olga, MD  oxymetazoline [facility](AFRIN NASAL SPRAY) 0.05 % nasal spray Place 2 sprays  into both nostrils 2 (two) times daily. Use no more than 3 days Patient not taking: Reported on 02/16/2017 11/27/15   Julianne Rice, MD  pantoprazole (PROTONIX) 20 MG tablet Take 1 tablet (20 mg total) by mouth daily. Patient not taking: Reported on 02/16/2017 11/27/15   Julianne Rice, MD  ipratropium (ATROVENT) 0.06 % nasal spray Place 2 sprays into both nostrils 4 (four) times daily. Patient not taking: Reported on 07/22/2014 10/21/13 03/01/15  Gregor Hams, MD  Simethicone (GAS-X PO)  Take 1-2 tablets by mouth 2 (two) times daily as needed (gas build up).  03/01/15  [provider]    Family History Family History  Problem Relation Age of Onset  . Hypertension Mother     Social History Social History   Tobacco Use  . Smoking status: Former Research scientist (life sciences)  . Smokeless tobacco: Never Used  Substance Use Topics  . Alcohol use: No  . Drug use: No     Allergies   Tylenol [acetaminophen]   Review of Systems Review of Systems  Constitutional: Positive for chills, fatigue and fever (subjective).  HENT: Negative for congestion and sore throat.   Eyes: Negative for visual disturbance.  Respiratory: Negative for cough and shortness of breath.   Cardiovascular: Negative for chest pain.  Gastrointestinal: Positive for diarrhea and nausea. Negative for abdominal pain.  Musculoskeletal: Negative for neck pain and neck stiffness.  Neurological: Positive for headaches. Negative for syncope, speech difficulty, weakness and numbness.  Psychiatric/Behavioral: Negative for confusion.     Physical Exam Updated Vital Signs BP 114/83 (BP Location: Left Arm)   Pulse 75   Temp 99.2 F (37.3 C) (Oral)   Resp 18   Ht 5\' 11"  (1.803 m)   SpO2 100%   BMI 18.35 kg/m   Physical Exam Vitals signs and nursing note reviewed.  Constitutional:      Appearance: He is well-developed. He is not ill-appearing or diaphoretic.  HENT:     Head: Normocephalic and atraumatic.     Mouth/Throat:     Mouth: Mucous membranes are moist.     Pharynx: Oropharynx is clear.  Eyes:     Extraocular Movements: Extraocular movements intact.     Conjunctiva/sclera: Conjunctivae normal.     Pupils: Pupils are equal, round, and reactive to light.  Neck:     Musculoskeletal: Normal range of motion and neck supple.     Meningeal: Brudzinski's sign and Kernig's sign absent.  Cardiovascular:     Rate and Rhythm: Normal rate and regular rhythm.     Heart sounds: Normal heart sounds.  Pulmonary:      Effort: Pulmonary effort is normal.     Breath sounds: Normal breath sounds. No wheezing, rhonchi or rales.  Abdominal:     Palpations: Abdomen is soft.     Tenderness: There is no abdominal tenderness. There is no guarding.  Musculoskeletal: Normal range of motion.  Skin:    General: Skin is warm and dry.  Neurological:     Mental Status: He is alert.     Comments: CN 3-12 grossly intact A&O x4 GCS 15 Sensation and strength intact Gait nonataxic including with tandem walking Coordination with finger-to-nose WNL Neg romberg, neg pronator drift      ED Treatments / Results  Labs (all labs ordered are listed, but only abnormal results are displayed) Labs Reviewed  NOVEL CORONAVIRUS, NAA (HOSP ORDER, SEND-OUT TO REF LAB; TAT 18-24 HRS)    EKG None  Radiology No results found.  Procedures Procedures (including critical care time)  Medications Ordered in ED Medications - No data to display   Initial Impression / Assessment and Plan / ED Course  I have reviewed the triage vital signs and the nursing notes.  Pertinent labs & imaging results that were available during my care of the patient were reviewed by me and considered in my medical decision making (see chart for details).    33 year old male who presents to the ED today complaining of body aches, subjective fever, chills, headache, diarrhea.  Was recently around an individual at work he was coughing.  Patient states that he came to the ED to check himself out today as he has a 78-month-old daughter at home and is concerned.  He states that he feels improved today and is not currently having any symptoms as he took some Ibuprofen last night.  Appears comfortable on exam.  His vitals are stable.  Temp is mildly elevated 99.2.  Patient non tachycardic and nontachypneic.  He has no meningeal signs today.  No focal neuro deficits on exam either.  Discussed that we will swab patient for Covid at this time and have him self  isolate until he receives his results.  He is advised that if positive he needs to stay home for 2 weeks.  He has been given referral to [facility] health [facility] wellness as he does not currently have a primary care physician.  Strict return precautions have been discussed with patient.  He is in agreement with plan at this time stable for discharge home.  Charles Padilla was evaluated in Emergency Department on 07/23/2019 for the symptoms described in the history of present illness. He was evaluated in the context of the global COVID-19 pandemic, which necessitated consideration that the patient might be at risk for infection with the SARS-CoV-2 virus that causes COVID-19. Institutional protocols and algorithms that pertain to the evaluation of patients at risk for COVID-19 are in a state of rapid change based on information released by regulatory bodies including the CDC and federal and state organizations. These policies and algorithms were followed during the patient's care in the ED.  This note was prepared using Dragon voice recognition software and may include unintentional dictation errors due to the inherent limitations of voice recognition software.       Final Clinical Impressions(s) / ED Diagnoses   Final diagnoses:  Viral illness    ED Discharge Orders    None       Discharge Instructions     We have swabbed you for Covid 19 today - please stay home and self isolate until you receive your results. If negative you may return to work. If positive you need to stay home for 2 weeks starting today.   Continue taking Tylenol as needed for headache, body aches, fevers. Monitor your symptoms at home. Follow up with your PCP. If you do not have a PCP you may follow up with [facility] and Wellness for primary care needs.   Return to the ED for any worsening symptoms including worsening headache, blurry vision/double vision, neck stiffness, fevers > 100.4, confusion, rash, shortness of breath.         Tanda Rockers, PA-C 07/23/19 1718    Wynetta Fines, MD 07/23/19 2138

## 2019-07-23 NOTE — Discharge Instructions (Addendum)
We have swabbed you for Covid 19 today - please stay home and self isolate until you receive your results. If negative you may return to work. If positive you need to stay home for 2 weeks starting today.   Continue taking Tylenol as needed for headache, body aches, fevers. Monitor your symptoms at home. Follow up with your PCP. If you do not have a PCP you may follow up with [facility] and Wellness for primary care needs.   Return to the ED for any worsening symptoms including worsening headache, blurry vision/double vision, neck stiffness, fevers > 100.4, confusion, rash, shortness of breath.

## 2019-07-25 ENCOUNTER — Ambulatory Visit (INDEPENDENT_AMBULATORY_CARE_PROVIDER_SITE_OTHER)
Admission: RE | Admit: 2019-07-25 | Discharge: 2019-07-25 | Disposition: A | Discharge status: Home / Self Care | Payer: Managed Care, Other (non HMO) | Source: Ambulatory Visit

## 2019-07-25 ENCOUNTER — Telehealth: Payer: Managed Care, Other (non HMO) | Admitting: Physician Assistant

## 2019-07-25 DIAGNOSIS — Z20828 Contact with and (suspected) exposure to other viral communicable diseases: Secondary | ICD-10-CM

## 2019-07-25 DIAGNOSIS — Z20822 Contact with and (suspected) exposure to covid-19: Secondary | ICD-10-CM

## 2019-07-25 DIAGNOSIS — B349 Viral infection, unspecified: Secondary | ICD-10-CM

## 2019-07-25 NOTE — Progress Notes (Signed)
We are sorry you are not feeling well.  Here is how we plan to help!  Based on what you have shared with me, it looks like you may have a viral upper respiratory infection.  Upper respiratory infections are caused by a large number of viruses; however, rhinovirus is the most common cause. Another cause of your upper respiratory infection could be due to the coronavirus. Upon reviewing your chart, I see that you were seen in the emergency department 2 days ago for similar symptoms and were tested for the coronavirus. This test is still in process and has not resulted. I would recommend that you continue to self quarantine.  Symptoms vary from person to person, with common symptoms including sore throat, cough, fatigue or lack of energy and feeling of general discomfort.  A low-grade fever of up to 100.4 may present, but is often uncommon.  Symptoms vary however, and are closely related to a person's age or underlying illnesses.  The most common symptoms associated with an upper respiratory infection are nasal discharge or congestion, cough, sneezing, headache and pressure in the ears and face.  These symptoms usually persist for about 3 to 10 days, but can last up to 2 weeks.  It is important to know that upper respiratory infections do not cause serious illness or complications in most cases.    Upper respiratory infections can be transmitted from person to person, with the most common method of transmission being a person's hands.  The virus is able to live on the skin and can infect other persons for up to 2 hours after direct contact.  Also, these can be transmitted when someone coughs or sneezes; thus, it is important to cover the mouth to reduce this risk.  To keep the spread of the illness at bay, good hand hygiene is very important.  This is an infection that is most likely caused by a virus. There are no specific treatments other than to help you with the symptoms until the infection runs its course.   We are sorry you are not feeling well.  Here is how we plan to help!   For nasal congestion, you may use an oral decongestants such as Mucinex D or if you have glaucoma or high blood pressure use plain Mucinex.  Saline nasal spray or nasal drops can help and can safely be used as often as needed for congestion. You may also use over the counter fluticasone to help with the congestion.  If you do not have a history of heart disease, hypertension, diabetes or thyroid disease, prostate/bladder issues or glaucoma, you may also use Sudafed to treat nasal congestion.  It is highly recommended that you consult with a pharmacist or your primary care physician to ensure this medication is safe for you to take.     If you have a cough, you may use cough suppressants such as Delsym and Robitussin.  If you have glaucoma or high blood pressure, you can also use Coricidin HBP.    If you have a sore or scratchy throat, use a saltwater gargle-  to  teaspoon of salt dissolved in a 4-ounce to 8-ounce glass of warm water.  Gargle the solution for approximately 15-30 seconds and then spit.  It is important not to swallow the solution.  You can also use throat lozenges/cough drops and Chloraseptic spray to help with throat pain or discomfort.  Warm or cold liquids can also be helpful in relieving throat pain.  For fevers,  headache, body aches, or general discomfort, you can use Ibuprofen or Tylenol to help with those symptoms.   Some authorities believe that zinc sprays or the use of Echinacea may shorten the course of your symptoms.   HOME CARE . Only take medications as instructed by your medical team. . Be sure to drink plenty of fluids. Water is fine as well as fruit juices, sodas and electrolyte beverages. You may want to stay away from caffeine or alcohol. If you are nauseated, try taking small sips of liquids. How do you know if you are getting enough fluid? Your urine should be a pale yellow or almost  colorless. . Get rest. . Taking a steamy shower or using a humidifier may help nasal congestion and ease sore throat pain. You can place a towel over your head and breathe in the steam from hot water coming from a faucet. . Using a saline nasal spray works much the same way. . Cough drops, hard candies and sore throat lozenges may ease your cough. . Avoid close contacts especially the very young and the elderly . Cover your mouth if you cough or sneeze . Always remember to wash your hands.   GET HELP RIGHT AWAY IF: . You develop worsening fever. . If your symptoms do not improve within 10 days . You develop yellow or green discharge from your nose over 3 days. . You have coughing fits . You develop a severe head ache or visual changes. . You develop shortness of breath, difficulty breathing or start having chest pain . Your symptoms persist after you have completed your treatment plan  MAKE SURE YOU   Understand these instructions.  Will watch your condition.  Will get help right away if you are not doing well or get worse.  Your e-visit answers were reviewed by a board certified advanced clinical practitioner to complete your personal care plan. Depending upon the condition, your plan could have included both over the counter or prescription medications. Please review your pharmacy choice. If there is a problem, you may call our nursing hot line at and have the prescription routed to another pharmacy. Your safety is important to [place]. If you have drug allergies check your prescription carefully.   You can use MyChart to ask questions about today's visit, request a non-urgent call back, or ask for a work or school excuse for 24 hours related to this e-Visit. If it has been greater than 24 hours you will need to follow up with your provider, or enter a new e-Visit to address those concerns. You will get an e-mail in the next two days asking about your experience.  I hope that your e-visit  has been valuable and will speed your recovery. Thank you for using e-visits.  Greater than 5 minutes, yet less than 10 minutes of time have been spend researching, coordinating, and implementing care for this patient today.

## 2019-07-25 NOTE — Discharge Instructions (Addendum)
Continue to check your MyChart account for your Covid test results.    Continue to take Tylenol as needed for fever or discomfort.  Rest and keep yourself hydrated with clear liquids.    Come to the Urgent Care to be seen in person or follow-up with your primary care provider if your symptoms are not improving.

## 2019-07-25 NOTE — ED Provider Notes (Signed)
Virtual Visit via Video Note:  Charles Padilla  initiated request for Telemedicine visit with [facility] team. I connected with Charles Padilla  on 07/25/2019 at 12:27 PM  for a synchronized telemedicine visit using a video enabled HIPPA compliant telemedicine application. I verified that I am speaking with Charles Padilla  using two identifiers. Charles Balloon, NP  was physically located in a [facility]gent care site and Charles Padilla was located at a different location.   The limitations of evaluation and management by telemedicine as well as the availability of in-person appointments were discussed. Patient was informed that he  may incur a bill ( including co-pay) for this virtual visit encounter. Charles Padilla  expressed understanding and gave verbal consent to proceed with virtual visit.     History of Present Illness:Charles Padilla  is a 33 y.o. male presents for evaluation of headache on the right side of his face and head x 3 days.  Patient reports his headache is mild and resolves with Tylenol.  He was seen at [facility] ED on 07/23/2019 and diagnosed with a viral illness.  His COVID test from this visit is still pending.  He states he had a temperature of 99.4 while he was in the ED but has not been febrile since.  He denies sore throat, cough, shortness of breath, vomiting, diarrhea, rash, or other symptoms.   Allergies  Allergen Reactions  . Tylenol [Acetaminophen]     Liver enzymes      Past Medical History:  Diagnosis Date  . Asthma      Social History   Tobacco Use  . Smoking status: Former Research scientist (life sciences)  . Smokeless tobacco: Never Used  Substance Use Topics  . Alcohol use: No  . Drug use: No        Observations/Objective: Physical Exam  VITALS: Patient denies current fever. GENERAL: Alert, appears well and in no acute distress. HEENT: Atraumatic. NECK: Normal movements of the head and neck. CARDIOPULMONARY: No increased WOB. Speaking in clear sentences. I:E  ratio WNL.  MS: Moves all visible extremities without noticeable abnormality. PSYCH: Pleasant and cooperative, well-groomed. Speech normal rate and rhythm. Affect is appropriate. Insight and judgement are appropriate. Attention is focused, linear, and appropriate.  NEURO: CN grossly intact. Oriented as arrived to appointment on time with no prompting. Moves both UE equally.     Assessment and Plan:    ICD-10-CM   1. Viral illness  B34.9        Follow Up Instructions: Instructed patient to continue to check his MyChart account for his Covid test result.  Instructed him to continue to self quarantine until this is back.  Discussed that he can continue to take Tylenol as needed for fever or discomfort.  Discussed that he should rest and keep himself hydrated with clear liquids.  Instructed patient to be seen in person at the urgent care or with his PCP if his symptoms or not improving.  Patient agrees to plan of care.      I discussed the assessment and treatment plan with the patient. The patient was provided an opportunity to ask questions and all were answered. The patient agreed with the plan and demonstrated an understanding of the instructions.   The patient was advised to call back or seek an in-person evaluation if the symptoms worsen or if the condition fails to improve as anticipated.      Charles Balloon, NP  07/25/2019 12:27 PM  Charles Bail, NP 07/25/19 1228

## 2019-07-26 ENCOUNTER — Ambulatory Visit
Admission: RE | Admit: 2019-07-26 | Discharge: 2019-07-26 | Disposition: A | Discharge status: Home / Self Care | Payer: Managed Care, Other (non HMO) | Source: Ambulatory Visit

## 2019-07-26 DIAGNOSIS — U071 COVID-19: Secondary | ICD-10-CM

## 2019-07-26 LAB — NOVEL CORONAVIRUS, NAA (HOSP ORDER, SEND-OUT TO REF LAB; TAT 18-24 HRS): SARS-CoV-2, NAA: DETECTED — AB

## 2019-07-26 NOTE — Discharge Instructions (Addendum)
Take Tylenol as needed for fever or discomfort.  Continue to self quarantine for the next 10 days.     Go to the emergency department if you develop high fever, shortness of breath, severe diarrhea, or other concerning symptoms.

## 2019-07-26 NOTE — ED Provider Notes (Signed)
Virtual Visit via Video Note:  Charles Padilla  initiated request for Telemedicine visit with [facility]gent [facility] team. I connected with Charles Padilla  on 07/26/2019 at 2:33 PM  for a synchronized telemedicine visit using a video enabled HIPPA compliant telemedicine application. I verified that I am speaking with Charles Padilla  using two identifiers. Sharion Balloon, NP  was physically located in a [facility] Urgent care site and Charles Padilla was located at a different location.   The limitations of evaluation and management by telemedicine as well as the availability of in-person appointments were discussed. Patient was informed that he  may incur a bill ( including co-pay) for this virtual visit encounter. Charles Padilla  expressed understanding and gave verbal consent to proceed with virtual visit.     History of Present Illness:Charles Padilla  is a 33 y.o. male presents with questions about his COVID positive test.  Patient received his positive COVID test result this morning.  He states he is currently asymptomatic today.  He denies fever, chills, headache, cough, shortness of breath, vomiting, diarrhea, or other symptoms.  He has not taken Tylenol since yesterday.   Allergies  Allergen Reactions  . Tylenol [Acetaminophen]     Liver enzymes      Past Medical History:  Diagnosis Date  . Asthma      Social History   Tobacco Use  . Smoking status: Former Research scientist (life sciences)  . Smokeless tobacco: Never Used  Substance Use Topics  . Alcohol use: No  . Drug use: No        Observations/Objective: Physical Exam  VITALS: Patient denies fever. GENERAL: Alert, appears well and in no acute distress. HEENT: Atraumatic. NECK: Normal movements of the head and neck. CARDIOPULMONARY: No increased WOB. Speaking in clear sentences. I:E ratio WNL.  MS: Moves all visible extremities without noticeable abnormality. PSYCH: Pleasant and cooperative, well-groomed. Speech normal rate and rhythm. Affect is  appropriate. Insight and judgement are appropriate. Attention is focused, linear, and appropriate.  NEURO: CN grossly intact. Oriented as arrived to appointment on time with no prompting. Moves both UE equally.     Assessment and Plan:    ICD-10-CM   1. COVID-19  U07.1        Follow Up Instructions: Discussed symptoms and treatment of COVID with patient at length.  Instructed him to take Tylenol as needed for fever or discomfort.  Instructed him to remain self quarantined for the next 10 days.  Instructed patient to go to the emergency department if he develops a high fever that does not respond to Tylenol, shortness of breath, severe diarrhea, or other concerning symptoms.  Patient agrees to plan of care    I discussed the assessment and treatment plan with the patient. The patient was provided an opportunity to ask questions and all were answered. The patient agreed with the plan and demonstrated an understanding of the instructions.   The patient was advised to call back or seek an in-person evaluation if the symptoms worsen or if the condition fails to improve as anticipated.      Sharion Balloon, NP  07/26/2019 2:33 PM         Sharion Balloon, NP 07/26/19 1435

## 2020-10-09 ENCOUNTER — Other Ambulatory Visit: Payer: Self-pay

## 2020-10-09 DIAGNOSIS — Z20822 Contact with and (suspected) exposure to covid-19: Secondary | ICD-10-CM

## 2020-10-11 LAB — NOVEL CORONAVIRUS, NAA: SARS-CoV-2, NAA: DETECTED — AB

## 2020-10-11 LAB — SARS-COV-2, NAA 2 DAY TAT

## 2024-02-17 ENCOUNTER — Other Ambulatory Visit: Payer: Self-pay | Admitting: Podiatry

## 2024-02-17 DIAGNOSIS — M778 Other enthesopathies, not elsewhere classified: Secondary | ICD-10-CM

## 2024-02-18 ENCOUNTER — Ambulatory Visit

## 2024-02-18 ENCOUNTER — Ambulatory Visit: Admitting: Podiatry

## 2024-02-18 DIAGNOSIS — M7671 Peroneal tendinitis, right leg: Secondary | ICD-10-CM | POA: Diagnosis not present

## 2024-02-18 DIAGNOSIS — M778 Other enthesopathies, not elsewhere classified: Secondary | ICD-10-CM

## 2024-02-18 DIAGNOSIS — M79671 Pain in right foot: Secondary | ICD-10-CM | POA: Diagnosis not present

## 2024-02-18 MED ORDER — MELOXICAM 15 MG PO TABS: 15.0000 mg | ORAL_TABLET | Freq: Every day | ORAL | 0 refills | Status: AC

## 2024-02-18 NOTE — Progress Notes (Signed)
  Subjective:  Patient ID: Charles Padilla, male    DOB: 1985/10/09,   MRN: [id number]  No chief complaint on file.   38 y.o. male presents for concern or right foot pain that has been ongoing for several months. Started with a  machine dropped on his foot and then started running and been getting pain on the outside of his foot when running. Has tried supportive shoes and stretching so far. Has a son on life support and dealing with this as well.  . Denies any other pedal complaints. Denies n/v/f/c.   Past Medical History:  Diagnosis Date   Asthma     Objective:  Physical Exam: Vascular: DP/PT pulses 2/4 bilateral. CFT <3 seconds. Normal hair growth on digits. No edema.  Skin. No lacerations or abrasions bilateral feet.  Musculoskeletal: MMT 5/5 bilateral lower extremities in DF, PF, Inversion and Eversion. Deceased ROM in DF of ankle joint. Tender to peroneal tendon proximal insertion site on the right foot. Pain with inversion and eversion.  Neurological: Sensation intact to light touch.   Assessment:   1. Peroneal tendonitis, right      Plan:  Patient was evaluated and treated and all questions answered. X-rays reviewed and discussed with patient. No acute fractures or dislocatiosn. Pes cavus noted.  Discussed peroneal tendinitis and treatment options at length with patient Discussed stretching exercises and provided handout. Prescription for meloxicam provided. CMPj reviewed and preivous Cr has been elevated will only do for one month.  Dispensed Tri-Lock ankle brace. Discussed that if the symptoms do not improve can consider PT/MRI. Patient to return in 6 to 8 weeks or sooner if symptoms fail to improve or worsen.   Jennefer Moats, DPM

## 2024-02-18 NOTE — Patient Instructions (Signed)
 Peroneal Tendinopathy Rehab Ask your health care provider which exercises are safe for you. Do exercises exactly as told by your health care provider and adjust them as directed. It is normal to feel mild stretching, pulling, tightness, or discomfort as you do these exercises. Stop right away if you feel sudden pain or your pain gets worse. Do not begin these exercises until told by your health care provider. Stretching and range-of-motion exercises These exercises warm up your muscles and joints. They can help improve the movement and flexibility of your ankle. They may also help to relieve pain and stiffness. Gastrocnemius and soleus stretch, standing This is an exercise in which you stand on a step and use your body weight to stretch your calf muscles. To do this exercise: Stand on the edge of a step on the ball of your left / right foot. The ball of your foot is on the walking surface, right under your toes. Keep your other foot firmly on the same step. Hold on to the wall, a railing, or a chair for balance. Slowly lift your other foot, allowing your body weight to press your left / right heel down over the edge of the step. You should feel a stretch in your left / right calf (gastrocnemius and soleus). Hold this position for __________ seconds. Return both feet to the step. Repeat this exercise with a slight bend in your left / right knee. Repeat __________ times with your left / right knee straight and __________ times with your left / right knee bent. Complete this exercise __________ times a day. Strengthening exercises These exercises build strength and endurance in your foot and ankle. Endurance is the ability to use your muscles for a long time, even after they get tired. Ankle dorsiflexion with band  Secure a rubber exercise band or tube to an object, such as a table leg, that will not move when the band is pulled. Secure the other end of the band around your left / right foot. Sit on  the floor. Face the object with your left / right leg extended. The band or tube should be slightly tense when your foot is relaxed. Slowly flex your left / right ankle and toes to bring your foot toward you (dorsiflexion). Hold this position for __________ seconds. Let the band or tube slowly pull your foot back to the starting position. Repeat __________ times. Complete this exercise __________ times a day. Ankle eversion  Sit on the floor with your legs straight out in front of you. Loop a rubber exercise band or tube around the ball of your left / right foot. The ball of your foot is on the walking surface, right under your toes. Hold the ends of the band in your hands. You can also secure the band to a stable object. The band or tube should be slightly tense when your foot is relaxed. Slowly push your foot outward, away from your other leg (eversion). Hold this position for __________ seconds. Slowly return your foot to the starting position. Repeat __________ times. Complete this exercise __________ times a day. Plantar flexion, standing This exercise is sometimes called a standing heel raise. Stand with your feet shoulder-width apart. Place your hands on a wall or table to steady yourself as needed. Try not to use it for support. Keep your weight spread evenly over the width of your feet while you slowly rise up on your toes (plantar flexion). If told by your health care provider: Shift your weight  toward your left / right leg until you feel challenged. Stand on your left / right leg only. Hold this position for __________ seconds. Repeat __________ times. Complete this exercise __________ times a day. Single leg stand  Without shoes, stand near a railing or in a doorway. You may hold on to the railing or doorframe as needed. Stand on your left / right foot. Keep your big toe down on the floor and try to keep your arch lifted. Do not roll to the outside of your foot. If this  exercise is too easy, you can try it with your eyes closed or while standing on a pillow. Hold this position for __________ seconds. Repeat __________ times. Complete this exercise __________ times a day. This information is not intended to replace advice given to you by your health care provider. Make sure you discuss any questions you have with your health care provider. Document Revised: 12/19/2021 Document Reviewed: 12/19/2021 Elsevier Patient Education  2024 [employer].

## 2024-03-31 ENCOUNTER — Ambulatory Visit (INDEPENDENT_AMBULATORY_CARE_PROVIDER_SITE_OTHER): Admitting: Podiatry

## 2024-03-31 DIAGNOSIS — Z91199 Patient's noncompliance with other medical treatment and regimen due to unspecified reason: Secondary | ICD-10-CM

## 2024-03-31 NOTE — Progress Notes (Signed)
 No show

## 2024-06-03 ENCOUNTER — Encounter: Payer: Self-pay | Admitting: Podiatry

## 2024-06-03 ENCOUNTER — Ambulatory Visit (INDEPENDENT_AMBULATORY_CARE_PROVIDER_SITE_OTHER): Admitting: Podiatry

## 2024-06-03 DIAGNOSIS — M7671 Peroneal tendinitis, right leg: Secondary | ICD-10-CM

## 2024-06-03 NOTE — Progress Notes (Signed)
  Subjective:  Patient ID: Charles Padilla, male    DOB: 1986-07-27,   MRN: [id number]  Chief Complaint  Patient presents with   Tendonitis    It's doing okay, I guess.  If I sit awhile and get up, it hurt on the side of my heel.  The pain moved.  I only took the medication one time.    38 y.o. male presents for foillow-up of right peroneal tendonitis. Relates he is doing better abou 65%. Relates still getting pain off and on and has moved some. He only tried the meloxicam  once. He has been stretching and worse the brace for a couple weeks. . Has a son on life support and dealing with this as well.  . Denies any other pedal complaints. Denies n/v/f/c.   Past Medical History:  Diagnosis Date   Asthma     Objective:  Physical Exam: Vascular: DP/PT pulses 2/4 bilateral. CFT <3 seconds. Normal hair growth on digits. No edema.  Skin. No lacerations or abrasions bilateral feet.  Musculoskeletal: MMT 5/5 bilateral lower extremities in DF, PF, Inversion and Eversion. Deceased ROM in DF of ankle joint. Tender to peroneal tendon proximal insertion site on the right foot. Pain with inversion and eversion.  Neurological: Sensation intact to light touch.   Assessment:   1. Peroneal tendonitis, right       Plan:  Patient was evaluated and treated and all questions answered. X-rays reviewed and discussed with patient. No acute fractures or dislocatiosn. Pes cavus noted.  Discussed peroneal tendinitis and treatment options at length with patient Continue stretching.  Continue anti-inflammatories as needed.  Continue brace.  AMB ref to PT placed.  Discussed that if the symptoms do not improve can consider injection/MRI. Patient to return in 6 to 8 weeks or sooner if symptoms fail to improve or worsen.   Asberry Failing, DPM

## 2024-08-12 ENCOUNTER — Ambulatory Visit (INDEPENDENT_AMBULATORY_CARE_PROVIDER_SITE_OTHER): Admitting: Podiatry

## 2024-08-12 DIAGNOSIS — Z91199 Patient's noncompliance with other medical treatment and regimen due to unspecified reason: Secondary | ICD-10-CM

## 2024-08-12 NOTE — Progress Notes (Signed)
 No show
